# Patient Record
Sex: Male | Born: 1935 | Race: Black or African American | Hispanic: No | Marital: Married | Smoking: Smoker, current status unknown
Health system: Southern US, Community
[De-identification: ages and names within clinical notes are randomized; demographics above are authoritative.]

## PROBLEM LIST (undated history)

## (undated) DIAGNOSIS — R972 Elevated prostate specific antigen [PSA]: Secondary | ICD-10-CM

## (undated) DIAGNOSIS — G309 Alzheimer's disease, unspecified: Secondary | ICD-10-CM

## (undated) DIAGNOSIS — R32 Unspecified urinary incontinence: Secondary | ICD-10-CM

## (undated) DIAGNOSIS — F17200 Nicotine dependence, unspecified, uncomplicated: Secondary | ICD-10-CM

## (undated) DIAGNOSIS — R4182 Altered mental status, unspecified: Secondary | ICD-10-CM

## (undated) DIAGNOSIS — I10 Essential (primary) hypertension: Secondary | ICD-10-CM

## (undated) DIAGNOSIS — E539 Vitamin B deficiency, unspecified: Secondary | ICD-10-CM

## (undated) DIAGNOSIS — N401 Enlarged prostate with lower urinary tract symptoms: Secondary | ICD-10-CM

## (undated) DIAGNOSIS — N138 Other obstructive and reflux uropathy: Secondary | ICD-10-CM

## (undated) DIAGNOSIS — J309 Allergic rhinitis, unspecified: Secondary | ICD-10-CM

## (undated) DIAGNOSIS — M1990 Unspecified osteoarthritis, unspecified site: Secondary | ICD-10-CM

## (undated) DIAGNOSIS — F0280 Dementia in other diseases classified elsewhere without behavioral disturbance: Secondary | ICD-10-CM

## (undated) DIAGNOSIS — D649 Anemia, unspecified: Secondary | ICD-10-CM

## (undated) DIAGNOSIS — F0390 Unspecified dementia without behavioral disturbance: Secondary | ICD-10-CM

## (undated) DIAGNOSIS — E039 Hypothyroidism, unspecified: Secondary | ICD-10-CM

## (undated) DIAGNOSIS — R609 Edema, unspecified: Secondary | ICD-10-CM

## (undated) HISTORY — DX: Unspecified dementia, unspecified severity, without behavioral disturbance, psychotic disturbance, mood disturbance, and anxiety: F03.90

## (undated) HISTORY — DX: Essential (primary) hypertension: I10

## (undated) HISTORY — DX: Altered mental status, unspecified: R41.82

## (undated) HISTORY — DX: Anemia, unspecified: D64.9

---

## 2000-01-22 ENCOUNTER — Emergency Department (HOSPITAL_COMMUNITY): Admission: EM | Admit: 2000-01-22 | Discharge: 2000-01-22 | Payer: Self-pay | Admitting: *Deleted

## 2003-02-09 ENCOUNTER — Encounter: Payer: Self-pay | Admitting: Emergency Medicine

## 2003-02-09 ENCOUNTER — Emergency Department (HOSPITAL_COMMUNITY): Admission: EM | Admit: 2003-02-09 | Discharge: 2003-02-09 | Payer: Self-pay | Admitting: Emergency Medicine

## 2006-04-07 ENCOUNTER — Emergency Department (HOSPITAL_COMMUNITY): Admission: EM | Admit: 2006-04-07 | Discharge: 2006-04-07 | Payer: Self-pay | Admitting: Emergency Medicine

## 2007-07-21 ENCOUNTER — Emergency Department (HOSPITAL_COMMUNITY): Admission: EM | Admit: 2007-07-21 | Discharge: 2007-07-22 | Payer: Self-pay | Admitting: Emergency Medicine

## 2011-02-10 ENCOUNTER — Ambulatory Visit: Payer: Self-pay | Admitting: Endocrinology

## 2011-03-12 ENCOUNTER — Encounter: Payer: Self-pay | Admitting: Endocrinology

## 2011-03-12 ENCOUNTER — Other Ambulatory Visit (INDEPENDENT_AMBULATORY_CARE_PROVIDER_SITE_OTHER): Payer: Medicare Other

## 2011-03-12 ENCOUNTER — Ambulatory Visit (INDEPENDENT_AMBULATORY_CARE_PROVIDER_SITE_OTHER): Payer: Medicare Other | Admitting: Endocrinology

## 2011-03-12 DIAGNOSIS — F0280 Dementia in other diseases classified elsewhere without behavioral disturbance: Secondary | ICD-10-CM

## 2011-03-12 DIAGNOSIS — M1990 Unspecified osteoarthritis, unspecified site: Secondary | ICD-10-CM

## 2011-03-12 DIAGNOSIS — G309 Alzheimer's disease, unspecified: Secondary | ICD-10-CM

## 2011-03-12 DIAGNOSIS — E079 Disorder of thyroid, unspecified: Secondary | ICD-10-CM

## 2011-03-12 DIAGNOSIS — N400 Enlarged prostate without lower urinary tract symptoms: Secondary | ICD-10-CM

## 2011-03-12 DIAGNOSIS — E039 Hypothyroidism, unspecified: Secondary | ICD-10-CM

## 2011-03-12 DIAGNOSIS — I10 Essential (primary) hypertension: Secondary | ICD-10-CM

## 2011-03-12 DIAGNOSIS — J309 Allergic rhinitis, unspecified: Secondary | ICD-10-CM

## 2011-03-12 LAB — TSH: TSH: 2.61 u[IU]/mL (ref 0.35–5.50)

## 2011-03-12 NOTE — Patient Instructions (Addendum)
Let's check your thyroid blood test today.  Let's also check a thyroid ultrasound.  you will be called with a day and time for an appointment for the ultrasound.  Results will be sent to [facility].  Return here as needed.  (update: tsh result is normal--sent to [facility])

## 2011-03-12 NOTE — Progress Notes (Signed)
  Subjective:    Patient ID: Richard Booth, male    DOB: 1934-10-06, 77 y.o.   MRN: [id number]  HPI Pt states few years of hypothyroidism.  He takes synthroid.  He feels well, except for a few years of slight congestion in the nose, and assoc rhinorrhea.   Past Medical History  Diagnosis Date  . Anemia   . Hypertension   . Dementia   . Altered mental status    No past surgical history on file.  reports that he has been smoking.  He does not have any smokeless tobacco history on file. He reports that he drinks alcohol. His drug history not on file. Retired. Married. Lives at [facility]. family history is not on file.no thyroid dz.  Allergies not on file  Review of Systems denies depression, cramps, sob, weight gain, memory loss, constipation, numbness, blurry vision, chest-pain, myalgias, and syncope.  He has only some male-pattern hair loss, and dry skin.   He has easy bruising.      Objective:   Physical Exam VS: see vs page GEN: elderly, frail, no distress HEAD: head: no deformity eyes: no periorbital swelling, no proptosis external nose and ears are normal mouth: no lesion seen NECK: supple, thyroid is not enlarged CHEST WALL: no deformity BREASTS:  No gynecomastia CV: reg rate and rhythm, no murmur ABD: abdomen is soft, nontender.  no hepatosplenomegaly.  not distended.  no hernia MUSCULOSKELETAL: muscle bulk and strength are grossly normal for age.  no obvious joint swelling.  gait is steady with a walker. EXTEMITIES: no deformity.  no ulcer on the feet.  feet are of normal color and temp.  no edema PULSES: dorsalis pedis intact bilat.   NEURO:  cn 2-12 grossly intact.   readily moves all 4's.  sensation is intact to touch on all 4'.  No tremor SKIN:  Normal texture and temperature.  No rash or suspicious lesion is visible.   NODES:  None palpable at the neck PSYCH: Does not appear anxious nor depressed.  alert and cooperative.  He is oriented to self only.         Labs:  i have reviewed "e-chart" from [facility].  No entries relevant to the thyroid.  Lab Results  Component Value Date   TSH 2.61 03/12/2011       Assessment & Plan:  Hypothyroidism, well-replaced. Nasal congestion, unrelated to thyroid. Dementia, not related to thyroid.

## 2011-03-14 DIAGNOSIS — N400 Enlarged prostate without lower urinary tract symptoms: Secondary | ICD-10-CM | POA: Insufficient documentation

## 2011-03-14 DIAGNOSIS — G309 Alzheimer's disease, unspecified: Secondary | ICD-10-CM | POA: Insufficient documentation

## 2011-03-14 DIAGNOSIS — I10 Essential (primary) hypertension: Secondary | ICD-10-CM | POA: Insufficient documentation

## 2011-03-14 DIAGNOSIS — J309 Allergic rhinitis, unspecified: Secondary | ICD-10-CM | POA: Insufficient documentation

## 2011-03-14 DIAGNOSIS — M1990 Unspecified osteoarthritis, unspecified site: Secondary | ICD-10-CM | POA: Insufficient documentation

## 2011-03-19 ENCOUNTER — Other Ambulatory Visit: Payer: Medicaid Other

## 2011-03-24 ENCOUNTER — Ambulatory Visit
Admission: RE | Admit: 2011-03-24 | Discharge: 2011-03-24 | Disposition: A | Discharge status: Home / Self Care | Payer: PRIVATE HEALTH INSURANCE | Source: Ambulatory Visit | Attending: Endocrinology | Admitting: Endocrinology

## 2011-03-24 DIAGNOSIS — E079 Disorder of thyroid, unspecified: Secondary | ICD-10-CM

## 2012-03-17 ENCOUNTER — Emergency Department (HOSPITAL_COMMUNITY): Payer: Medicare Other

## 2012-03-17 ENCOUNTER — Encounter (HOSPITAL_COMMUNITY): Payer: Self-pay | Admitting: Emergency Medicine

## 2012-03-17 ENCOUNTER — Emergency Department (HOSPITAL_COMMUNITY)
Admission: EM | Admit: 2012-03-17 | Discharge: 2012-03-17 | Disposition: A | Discharge status: Home Health | Payer: Medicare Other | Attending: Emergency Medicine | Admitting: Emergency Medicine

## 2012-03-17 DIAGNOSIS — F0390 Unspecified dementia without behavioral disturbance: Secondary | ICD-10-CM | POA: Insufficient documentation

## 2012-03-17 DIAGNOSIS — R55 Syncope and collapse: Secondary | ICD-10-CM | POA: Insufficient documentation

## 2012-03-17 DIAGNOSIS — N390 Urinary tract infection, site not specified: Secondary | ICD-10-CM | POA: Insufficient documentation

## 2012-03-17 DIAGNOSIS — Z79899 Other long term (current) drug therapy: Secondary | ICD-10-CM | POA: Insufficient documentation

## 2012-03-17 DIAGNOSIS — I10 Essential (primary) hypertension: Secondary | ICD-10-CM | POA: Insufficient documentation

## 2012-03-17 LAB — COMPREHENSIVE METABOLIC PANEL
ALT: 21 U/L (ref 0–53)
AST: 20 U/L (ref 0–37)
Calcium: 9.3 mg/dL (ref 8.4–10.5)
Creatinine, Ser: 1.01 mg/dL (ref 0.50–1.35)
Sodium: 139 mEq/L (ref 135–145)
Total Protein: 7.3 g/dL (ref 6.0–8.3)

## 2012-03-17 LAB — DIFFERENTIAL
Basophils Absolute: 0 10*3/uL (ref 0.0–0.1)
Lymphocytes Relative: 24 % (ref 12–46)
Monocytes Absolute: 0.4 10*3/uL (ref 0.1–1.0)
Neutro Abs: 3 10*3/uL (ref 1.7–7.7)
Neutrophils Relative %: 66 % (ref 43–77)

## 2012-03-17 LAB — URINE MICROSCOPIC-ADD ON

## 2012-03-17 LAB — URINALYSIS, ROUTINE W REFLEX MICROSCOPIC
Leukocytes, UA: NEGATIVE
Nitrite: POSITIVE — AB
Specific Gravity, Urine: 1.008 (ref 1.005–1.030)
pH: 7 (ref 5.0–8.0)

## 2012-03-17 LAB — CBC
HCT: 39 % (ref 39.0–52.0)
RDW: 14 % (ref 11.5–15.5)
WBC: 4.5 10*3/uL (ref 4.0–10.5)

## 2012-03-17 LAB — PROTIME-INR
INR: 0.99 (ref 0.00–1.49)
Prothrombin Time: 13.3 seconds (ref 11.6–15.2)

## 2012-03-17 MED ORDER — CIPROFLOXACIN HCL 500 MG PO TABS
500.0000 mg | ORAL_TABLET | Freq: Once | ORAL | Status: AC
  Administered 2012-03-17: 500 mg via ORAL
  Filled 2012-03-17: qty 1

## 2012-03-17 MED ORDER — SODIUM CHLORIDE 0.9 % IV SOLN
INTRAVENOUS | Status: DC
  Administered 2012-03-17: 11:00:00 via INTRAVENOUS

## 2012-03-17 MED ORDER — ONDANSETRON HCL 4 MG/2ML IJ SOLN
4.0000 mg | Freq: Once | INTRAMUSCULAR | Status: AC
  Administered 2012-03-17: 4 mg via INTRAVENOUS
  Filled 2012-03-17: qty 2

## 2012-03-17 MED ORDER — CIPROFLOXACIN HCL 500 MG PO TABS: 500.0000 mg | ORAL_TABLET | Freq: Two times a day (BID) | ORAL | Status: AC

## 2012-03-17 MED ORDER — POTASSIUM CHLORIDE 20 MEQ/15ML (10%) PO LIQD
20.0000 meq | Freq: Once | ORAL | Status: AC
  Administered 2012-03-17: 20 meq via ORAL
  Filled 2012-03-17: qty 15

## 2012-03-17 MED ORDER — ONDANSETRON HCL 4 MG PO TABS: 4.0000 mg | ORAL_TABLET | Freq: Four times a day (QID) | ORAL | Status: AC

## 2012-03-17 NOTE — ED Notes (Signed)
VHQ:IO96<EX> Expected date:03/17/12<BR> Expected time: 9:42 AM<BR> Means of arrival:Ambulance<BR> Comments:<BR> Vomiting

## 2012-03-17 NOTE — ED Notes (Addendum)
Pt bought in by EMS for a near syncopal episode after eating breakfast neg for LOC.EMS stated that pt vomited in route

## 2012-03-17 NOTE — ED Notes (Signed)
In and out cath attempted no urine in bladder

## 2012-03-17 NOTE — ED Provider Notes (Signed)
History     CSN: [id number]  Arrival date & time 03/17/12  1000   First MD Initiated Contact with Patient 03/17/12 1019      Chief Complaint  Patient presents with  . Near Syncope  . Emesis    (Consider location/radiation/quality/duration/timing/severity/associated sxs/prior treatment) The history is provided by the nursing home and the EMS personnel. No language interpreter was used.  level V caveat due to dementia. Patient arrives via EMS for evaluation of a near syncopal event which occurred at breakfast. Patient did not lose consciousness. He vomited after this episode. At this time he has no complaints. He denies chest pain, shortness of breath, abdominal pain, headache, nausea.  Past Medical History  Diagnosis Date  . Anemia   . Hypertension   . Dementia   . Altered mental status     History reviewed. No pertinent past surgical history.  No family history on file.  History  Substance Use Topics  . Smoking status: Smoker, Current Status Unknown  . Smokeless tobacco: Not on file  . Alcohol Use: No      Review of Systems  Unable to perform ROS: Dementia    Allergies  Review of patient's allergies indicates not on file.  Home Medications   Current Outpatient Rx  Name Route Sig Dispense Refill  . ACETAMINOPHEN 500 MG PO TABS Oral Take 500 mg by mouth 3 (three) times daily.    Marland Kitchen ENSURE PLUS PO LIQD Oral Take 237 mLs by mouth 3 (three) times daily with meals.    Di Kindle SULFATE 325 (65 FE) MG PO TABS Oral Take 325 mg by mouth daily with breakfast.    . FINASTERIDE 5 MG PO TABS Oral Take 5 mg by mouth daily.      Marland Kitchen FLUTICASONE PROPIONATE 50 MCG/ACT NA SUSP Nasal 1 spray by Nasal route daily.      Marland Kitchen HYDROCHLOROTHIAZIDE 12.5 MG PO CAPS Oral Take 12.5 mg by mouth daily. Hold if systolic pressure is less than 110 or dystolic blood pressure is is less than 60    . LEVOTHYROXINE SODIUM 75 MCG PO TABS Oral Take 75 mcg by mouth daily.      Marland Kitchen LISINOPRIL 10 MG PO TABS  Oral Take 2.5 mg by mouth daily. 2.5 mg. Hold if systolic pressure is less than 110 or dystolic blood pressure is is less than 60    . MEMANTINE HCL 5 MG PO TABS Oral Take 5 mg by mouth 2 (two) times daily.    Marland Kitchen DAILY VITE PO TABS Oral Take 1 tablet by mouth daily.      Marland Kitchen SALINE NASAL MIST NA Nasal Place 2 sprays into the nose every 4 (four) hours.    . TERAZOSIN HCL 5 MG PO CAPS Oral Take 5 mg by mouth at bedtime.      . TRIAMCINOLONE ACETONIDE 0.025 % EX CREA Topical Apply 1 application topically 3 (three) times daily. Apply to bilateral lower extremities 3 times daily due to sores and scratching    . VITAMIN B-12 1000 MCG PO TABS Oral Take 1,000 mcg by mouth daily.      Marland Kitchen CIPROFLOXACIN HCL 500 MG PO TABS Oral Take 1 tablet (500 mg total) by mouth 2 (two) times daily. 14 tablet 0  . ONDANSETRON HCL 4 MG PO TABS Oral Take 1 tablet (4 mg total) by mouth every 6 (six) hours. 12 tablet 0    BP 158/100  Pulse 67  Temp(Src) 97.7 F (36.5 C) (Oral)  Resp 16  SpO2 96%  Physical Exam  Nursing note and vitals reviewed. Constitutional: He appears well-developed and well-nourished.  HENT:  Head: Normocephalic and atraumatic.  Mouth/Throat: Oropharynx is clear and moist.  Eyes: Conjunctivae and EOM are normal. Pupils are equal, round, and reactive to light.  Neck: Normal range of motion. Neck supple.  Cardiovascular: Regular rhythm, normal heart sounds and intact distal pulses.  Exam reveals no gallop and no friction rub.   No murmur heard.      Bradycardic rate  Pulmonary/Chest: Effort normal and breath sounds normal. No respiratory distress. He exhibits no tenderness.  Abdominal: Soft. Bowel sounds are normal. There is no tenderness.  Musculoskeletal: Normal range of motion. He exhibits no tenderness.  Neurological: He is alert. No cranial nerve deficit.  Skin: Skin is warm and dry. No rash noted.    ED Course  Procedures (including critical care time)   Date: 03/17/2012  Rate: 54   Rhythm: sinus bradycardia  QRS Axis: normal  Intervals: normal  ST/T Wave abnormalities: normal  Conduction Disutrbances:none  Narrative Interpretation:   Old EKG Reviewed: unchanged  Labs Reviewed  CBC - Abnormal; Notable for the following:    RBC 4.12 (*)    Hemoglobin 12.8 (*)    All other components within normal limits  URINALYSIS, ROUTINE W REFLEX MICROSCOPIC - Abnormal; Notable for the following:    Hgb urine dipstick MODERATE (*)    Nitrite POSITIVE (*)    All other components within normal limits  COMPREHENSIVE METABOLIC PANEL - Abnormal; Notable for the following:    Potassium 3.3 (*)    CO2 33 (*)    GFR calc non Af Amer 71 (*)    GFR calc Af Amer 82 (*)    All other components within normal limits  URINE MICROSCOPIC-ADD ON - Abnormal; Notable for the following:    Bacteria, UA MANY (*)    All other components within normal limits  DIFFERENTIAL  PROTIME-INR   Dg Chest 2 View  03/17/2012  *RADIOLOGY REPORT*  Clinical Data: Diabetes and hypertension.  CHEST - 2 VIEW  Comparison: PA and lateral chest 01/18/2007.  Findings: Mild cardiomegaly without pulmonary edema is identified. The lungs are clear.  Tiny left pleural effusion versus pleural scar is noted. No pneumothorax.  IMPRESSION: Tiny left pleural effusion versus pleural scar.  Otherwise negative.  Original Report Authenticated By: Bernadene Bell. D'ALESSIO, M.D.     1. UTI (lower urinary tract infection)   2. Near syncope       MDM  UTI most likely the cause of the near syncopal event. He received IV fluids and antiemetics the emergency department. He is able tolerate oral intake. We'll be discharged home with Cipro. Provided instructions for return        Dayton Bailiff, MD 03/17/12 1526

## 2012-03-17 NOTE — Discharge Instructions (Signed)
Near-Syncope Near-syncope is sudden weakness, dizziness, or feeling like you might pass out (faint). This may occur when getting up after sitting or while standing for a long period of time. Near-syncope can be caused by a drop in blood pressure. This is a common reaction, but it may occur to a greater degree in people taking medicines to control their blood pressure. Fainting often occurs when the blood pressure or pulse is too low to provide enough blood flow to the brain to keep you conscious. Fainting and near-syncope are not usually due to serious medical problems. However, certain people should be more cautious in the event of near-syncope, including elderly patients, patients with diabetes, and patients with a history of heart conditions (especially irregular rhythms).  CAUSES   Drop in blood pressure.   Physical pain.   Dehydration.   Heat exhaustion.   Emotional distress.   Low blood sugar.   Internal bleeding.   Heart and circulatory problems.   Infections.  SYMPTOMS   Dizziness.   Feeling sick to your stomach (nauseous).   Nearly fainting.   Body numbness.   Turning pale.   Tunnel vision.   Weakness.  HOME CARE INSTRUCTIONS   Lie down right away if you start feeling like you might faint. Breathe deeply and steadily. Wait until all the symptoms have passed. Most of these episodes last only a few minutes. You may feel tired for several hours.   Drink enough fluids to keep your urine clear or pale yellow.   If you are taking blood pressure or heart medicine, get up slowly, taking several minutes to sit and then stand. This can reduce dizziness that is caused by a drop in blood pressure.  SEEK IMMEDIATE MEDICAL CARE IF:   You have a severe headache.   Unusual pain develops in the chest, abdomen, or back.   There is bleeding from the mouth or rectum, or you have black or tarry stool.   An irregular heartbeat or a very rapid pulse develops.   You have  repeated fainting or seizure-like jerking during an episode.   You faint when sitting or lying down.   You develop confusion.   You have difficulty walking.   Severe weakness develops.   Vision problems develop.  MAKE SURE YOU:   Understand these instructions.   Will watch your condition.   Will get help right away if you are not doing well or get worse.  Document Released: 11/09/2005 Document Revised: 10/29/2011 Document Reviewed: 12/26/2010 [employer] Patient Information 2012 [employer]. 

## 2012-03-22 ENCOUNTER — Encounter (HOSPITAL_COMMUNITY): Payer: Self-pay | Admitting: Emergency Medicine

## 2012-03-22 ENCOUNTER — Emergency Department (HOSPITAL_COMMUNITY)
Admission: EM | Admit: 2012-03-22 | Discharge: 2012-03-23 | Disposition: A | Discharge status: Skilled Nursing Facility | Payer: Medicare Other | Attending: Emergency Medicine | Admitting: Emergency Medicine

## 2012-03-22 DIAGNOSIS — I10 Essential (primary) hypertension: Secondary | ICD-10-CM | POA: Insufficient documentation

## 2012-03-22 DIAGNOSIS — F0390 Unspecified dementia without behavioral disturbance: Secondary | ICD-10-CM | POA: Insufficient documentation

## 2012-03-22 DIAGNOSIS — Z79899 Other long term (current) drug therapy: Secondary | ICD-10-CM | POA: Insufficient documentation

## 2012-03-22 DIAGNOSIS — Z711 Person with feared health complaint in whom no diagnosis is made: Secondary | ICD-10-CM

## 2012-03-22 NOTE — ED Notes (Signed)
EMS brings pt from [facility], pt co single episode of hematuria, pt currently being treated for UTI.

## 2012-03-22 NOTE — ED Notes (Signed)
Orie Rout [phone]. Daughter- please call with information

## 2012-03-22 NOTE — ED Provider Notes (Signed)
History     CSN: [id number]  Arrival date & time 03/22/12  2100   First MD Initiated Contact with Patient 03/22/12 2300      Chief Complaint  Patient presents with  . Hematuria    (Consider location/radiation/quality/duration/timing/severity/associated sxs/prior treatment) HPI Comments: 78 y/o male presents after having single episodes of hematuria - EMS states pt is currently being treated for UTI with abx.  Pt has no c/o and has dementia - unsure why he is here, has no c/o.  Patient is a 78 y.o. male presenting with hematuria. The history is provided by the EMS personnel and the nursing home. The history is limited by the condition of the patient (dementia).  Hematuria    Past Medical History  Diagnosis Date  . Anemia   . Hypertension   . Dementia   . Altered mental status     History reviewed. No pertinent past surgical history.  No family history on file.  History  Substance Use Topics  . Smoking status: Smoker, Current Status Unknown  . Smokeless tobacco: Not on file  . Alcohol Use: No      Review of Systems  Unable to perform ROS: Dementia  Genitourinary: Positive for hematuria.    Allergies  Review of patient's allergies indicates no known allergies.  Home Medications   Current Outpatient Rx  Name Route Sig Dispense Refill  . ACETAMINOPHEN 500 MG PO TABS Oral Take 500 mg by mouth 3 (three) times daily.    Marland Kitchen CIPROFLOXACIN HCL 500 MG PO TABS Oral Take 1 tablet (500 mg total) by mouth 2 (two) times daily. 14 tablet 0  . ENSURE PLUS PO LIQD Oral Take 237 mLs by mouth 3 (three) times daily with meals.    Di Kindle SULFATE 325 (65 FE) MG PO TABS Oral Take 325 mg by mouth daily with breakfast.    . FINASTERIDE 5 MG PO TABS Oral Take 5 mg by mouth daily.      Marland Kitchen FLUTICASONE PROPIONATE 50 MCG/ACT NA SUSP Nasal 1 spray by Nasal route daily.      Marland Kitchen HYDROCHLOROTHIAZIDE 12.5 MG PO CAPS Oral Take 12.5 mg by mouth daily. Hold if systolic pressure is less than 110  or dystolic blood pressure is is less than 60    . LEVOTHYROXINE SODIUM 75 MCG PO TABS Oral Take 75 mcg by mouth daily.      Marland Kitchen LISINOPRIL 10 MG PO TABS Oral Take 2.5 mg by mouth daily. 2.5 mg. Hold if systolic pressure is less than 110 or dystolic blood pressure is is less than 60    . MEMANTINE HCL 5 MG PO TABS Oral Take 5 mg by mouth 2 (two) times daily.    Marland Kitchen DAILY VITE PO TABS Oral Take 1 tablet by mouth daily.      Marland Kitchen ONDANSETRON HCL 4 MG PO TABS Oral Take 1 tablet (4 mg total) by mouth every 6 (six) hours. 12 tablet 0  . SALINE NASAL MIST NA Nasal Place 2 sprays into the nose every 4 (four) hours.    . TERAZOSIN HCL 5 MG PO CAPS Oral Take 5 mg by mouth at bedtime.      . TRIAMCINOLONE ACETONIDE 0.025 % EX CREA Topical Apply 1 application topically 3 (three) times daily. Apply to bilateral lower extremities 3 times daily due to sores and scratching    . VITAMIN B-12 1000 MCG PO TABS Oral Take 1,000 mcg by mouth daily.  BP 156/91  Pulse 62  Temp 98 F (36.7 C)  SpO2 99%  Physical Exam  Nursing note and vitals reviewed. Constitutional: He appears well-developed and well-nourished. No distress.  HENT:  Head: Normocephalic and atraumatic.  Mouth/Throat: Oropharynx is clear and moist. No oropharyngeal exudate.  Eyes: Conjunctivae and EOM are normal. Pupils are equal, round, and reactive to light. Right eye exhibits no discharge. Left eye exhibits no discharge. No scleral icterus.  Neck: Normal range of motion. Neck supple. No JVD present. No thyromegaly present.  Cardiovascular: Normal rate, regular rhythm, normal heart sounds and intact distal pulses.  Exam reveals no gallop and no friction rub.   No murmur heard. Pulmonary/Chest: Effort normal and breath sounds normal. No respiratory distress. He has no wheezes. He has no rales.  Abdominal: Soft. Bowel sounds are normal. He exhibits no distension and no mass. There is no tenderness.  Genitourinary:       Non tender abd, normal  appearing penis and scrotum, no blood at urethral meatus  Musculoskeletal: Normal range of motion. He exhibits no edema and no tenderness.  Lymphadenopathy:    He has no cervical adenopathy.  Neurological: He is alert. Coordination normal.       Pt talks but is unable to answer questions re: history  Skin: Skin is warm and dry. No rash noted. No erythema.  Psychiatric: He has a normal mood and affect. His behavior is normal.    ED Course  Procedures (including critical care time)  Labs Reviewed  URINALYSIS, ROUTINE W REFLEX MICROSCOPIC - Abnormal; Notable for the following:    Hgb urine dipstick TRACE (*)    All other components within normal limits  URINE MICROSCOPIC-ADD ON  URINE CULTURE   No results found.   1. Worried well       MDM  Pt has possible hematuria - has no abd ttp, no VS abnormalities of significance.  In and out cath    Urinalysis reviewed shows no signs of hematuria or infection. Culture sent  Vida Roller, MD 03/23/12 [phone]

## 2012-03-23 LAB — URINALYSIS, ROUTINE W REFLEX MICROSCOPIC
Bilirubin Urine: NEGATIVE
Nitrite: NEGATIVE
Protein, ur: NEGATIVE mg/dL
Specific Gravity, Urine: 1.015 (ref 1.005–1.030)
Urobilinogen, UA: 0.2 mg/dL (ref 0.0–1.0)

## 2012-03-23 LAB — URINE MICROSCOPIC-ADD ON

## 2012-03-23 NOTE — ED Notes (Signed)
[facility] called for transport.  

## 2012-03-23 NOTE — Discharge Instructions (Signed)
Your urine test is normal, see your doctor as needed

## 2012-03-24 LAB — URINE CULTURE: Culture: NO GROWTH

## 2013-03-08 ENCOUNTER — Encounter (HOSPITAL_COMMUNITY): Payer: Self-pay | Admitting: Emergency Medicine

## 2013-03-08 ENCOUNTER — Emergency Department (HOSPITAL_COMMUNITY)
Admission: EM | Admit: 2013-03-08 | Discharge: 2013-03-08 | Disposition: A | Discharge status: Home / Self Care | Payer: Medicare Other | Attending: Emergency Medicine | Admitting: Emergency Medicine

## 2013-03-08 ENCOUNTER — Emergency Department (HOSPITAL_COMMUNITY): Payer: Medicare Other

## 2013-03-08 DIAGNOSIS — I498 Other specified cardiac arrhythmias: Secondary | ICD-10-CM | POA: Insufficient documentation

## 2013-03-08 DIAGNOSIS — F0390 Unspecified dementia without behavioral disturbance: Secondary | ICD-10-CM | POA: Insufficient documentation

## 2013-03-08 DIAGNOSIS — Z79899 Other long term (current) drug therapy: Secondary | ICD-10-CM | POA: Insufficient documentation

## 2013-03-08 DIAGNOSIS — R112 Nausea with vomiting, unspecified: Secondary | ICD-10-CM | POA: Insufficient documentation

## 2013-03-08 DIAGNOSIS — D649 Anemia, unspecified: Secondary | ICD-10-CM

## 2013-03-08 DIAGNOSIS — R55 Syncope and collapse: Secondary | ICD-10-CM

## 2013-03-08 DIAGNOSIS — I10 Essential (primary) hypertension: Secondary | ICD-10-CM | POA: Insufficient documentation

## 2013-03-08 DIAGNOSIS — R001 Bradycardia, unspecified: Secondary | ICD-10-CM

## 2013-03-08 DIAGNOSIS — Z87891 Personal history of nicotine dependence: Secondary | ICD-10-CM | POA: Insufficient documentation

## 2013-03-08 LAB — COMPREHENSIVE METABOLIC PANEL
ALT: 18 U/L (ref 0–53)
AST: 25 U/L (ref 0–37)
CO2: 31 mEq/L (ref 19–32)
Calcium: 8.6 mg/dL (ref 8.4–10.5)
Creatinine, Ser: 1.08 mg/dL (ref 0.50–1.35)
GFR calc Af Amer: 75 mL/min — ABNORMAL LOW (ref >90)
GFR calc non Af Amer: 65 mL/min — ABNORMAL LOW (ref >90)
Glucose, Bld: 97 mg/dL (ref 70–99)
Sodium: 139 mEq/L (ref 135–145)
Total Protein: 6.5 g/dL (ref 6.0–8.3)

## 2013-03-08 LAB — URINALYSIS, ROUTINE W REFLEX MICROSCOPIC
Bilirubin Urine: NEGATIVE
Nitrite: NEGATIVE
Specific Gravity, Urine: 1.018 (ref 1.005–1.030)
Urobilinogen, UA: 1 mg/dL (ref 0.0–1.0)
pH: 7 (ref 5.0–8.0)

## 2013-03-08 LAB — CBC WITH DIFFERENTIAL/PLATELET
Basophils Absolute: 0 10*3/uL (ref 0.0–0.1)
Eosinophils Absolute: 0.1 10*3/uL (ref 0.0–0.7)
Eosinophils Relative: 2 % (ref 0–5)
HCT: 37.3 % — ABNORMAL LOW (ref 39.0–52.0)
MCH: 31.8 pg (ref 26.0–34.0)
MCHC: 33.8 g/dL (ref 30.0–36.0)
MCV: 94.2 fL (ref 78.0–100.0)
Monocytes Absolute: 0.3 10*3/uL (ref 0.1–1.0)
Platelets: 128 10*3/uL — ABNORMAL LOW (ref 150–400)
RDW: 13.2 % (ref 11.5–15.5)

## 2013-03-08 LAB — POCT I-STAT TROPONIN I: Troponin i, poc: 0 ng/mL (ref 0.00–0.08)

## 2013-03-08 MED ORDER — SODIUM CHLORIDE 0.9 % IV BOLUS (SEPSIS)
1000.0000 mL | Freq: Once | INTRAVENOUS | Status: AC
  Administered 2013-03-08: 1000 mL via INTRAVENOUS

## 2013-03-08 NOTE — ED Notes (Signed)
Patient transported to CT 

## 2013-03-08 NOTE — ED Provider Notes (Signed)
History     CSN: [id number]  Arrival date & time 03/08/13  1207   First MD Initiated Contact with Patient 03/08/13 1216      Chief Complaint  Patient presents with  . Loss of Consciousness    (Consider location/radiation/quality/duration/timing/severity/associated sxs/prior treatment) Patient is a 79 y.o. male presenting with syncope. The history is provided by the nursing home. The history is limited by the condition of the patient (Dementia).  Loss of Consciousness   He was reported to have had a syncopal episode this morning lasting about 1 minute. This was followed by nausea and vomiting. He was treated by EMS with intravenous Zofran. Patient has severe dementia and is not able to give any other history. He has no complaints currently.  Past Medical History  Diagnosis Date  . Anemia   . Hypertension   . Dementia   . Altered mental status     History reviewed. No pertinent past surgical history.  No family history on file.  History  Substance Use Topics  . Smoking status: Smoker, Current Status Unknown  . Smokeless tobacco: Not on file  . Alcohol Use: No      Review of Systems  Unable to perform ROS: Dementia  Cardiovascular: Positive for syncope.    Allergies  Review of patient's allergies indicates no known allergies.  Home Medications   Current Outpatient Rx  Name  Route  Sig  Dispense  Refill  . acetaminophen (TYLENOL) 500 MG tablet   Oral   Take 500 mg by mouth 3 (three) times daily.         . Ensure Plus (ENSURE PLUS) LIQD   Oral   Take 237 mLs by mouth 3 (three) times daily with meals.         . ferrous sulfate 325 (65 FE) MG tablet   Oral   Take 325 mg by mouth daily with breakfast.         . finasteride (PROSCAR) 5 MG tablet   Oral   Take 5 mg by mouth daily.           . fluticasone (FLONASE) 50 MCG/ACT nasal spray   Nasal   1 spray by Nasal route daily.           . hydrochlorothiazide (MICROZIDE) 12.5 MG capsule   Oral    Take 12.5 mg by mouth daily. Hold if systolic pressure is less than 110 or dystolic blood pressure is is less than 60         . levothyroxine (SYNTHROID, LEVOTHROID) 75 MCG tablet   Oral   Take 75 mcg by mouth daily.           Marland Kitchen lisinopril (PRINIVIL,ZESTRIL) 10 MG tablet   Oral   Take 2.5 mg by mouth daily. 2.5 mg. Hold if systolic pressure is less than 110 or dystolic blood pressure is is less than 60         . memantine (NAMENDA) 5 MG tablet   Oral   Take 5 mg by mouth 2 (two) times daily.         . Multiple Vitamin (DAILY VITE) TABS   Oral   Take 1 tablet by mouth daily.           Marland Kitchen SALINE NASAL MIST NA   Nasal   Place 2 sprays into the nose every 4 (four) hours.         Marland Kitchen terazosin (HYTRIN) 5 MG capsule   Oral   Take  5 mg by mouth at bedtime.           . triamcinolone (KENALOG) 0.025 % cream   Topical   Apply 1 application topically 3 (three) times daily. Apply to bilateral lower extremities 3 times daily due to sores and scratching         . vitamin B-12 (CYANOCOBALAMIN) 1000 MCG tablet   Oral   Take 1,000 mcg by mouth daily.             BP 106/70  Pulse 57  Temp(Src) 97.4 F (36.3 C) (Oral)  Resp 18  Physical Exam  Nursing note and vitals reviewed.  79 year old male, resting comfortably and in no acute distress. Vital signs are significant for mild bradycardia with heart rate 57. Oxygen saturation is 96%, which is normal. Head is normocephalic and atraumatic. Pupils are 2 mm and minimally reactive, EOMI. Arcus senilis is present. Oropharynx is clear. Neck is nontender and supple without adenopathy or JVD. Back is nontender and there is no CVA tenderness. Lungs are clear without rales, wheezes, or rhonchi. Chest is nontender. Heart has regular rate and rhythm without murmur. Abdomen is soft, flat, nontender without masses or hepatosplenomegaly and peristalsis is normoactive. Extremities have trace edema, full range of motion is  present. Skin is warm and dry without rash. Neurologic: He is awake and alert and oriented to person but not place or time, cranial nerves are intact, there are no motor or sensory deficits.  ED Course  Procedures (including critical care time)  Results for orders placed during the hospital encounter of 03/08/13  CBC WITH DIFFERENTIAL      Result Value Range   WBC 4.6  4.0 - 10.5 K/uL   RBC 3.96 (*) 4.22 - 5.81 MIL/uL   Hemoglobin 12.6 (*) 13.0 - 17.0 g/dL   HCT 16.1 (*) 09.6 - 04.5 %   MCV 94.2  78.0 - 100.0 fL   MCH 31.8  26.0 - 34.0 pg   MCHC 33.8  30.0 - 36.0 g/dL   RDW 40.9  81.1 - 91.4 %   Platelets 128 (*) 150 - 400 K/uL   Neutrophils Relative 71  43 - 77 %   Neutro Abs 3.3  1.7 - 7.7 K/uL   Lymphocytes Relative 19  12 - 46 %   Lymphs Abs 0.9  0.7 - 4.0 K/uL   Monocytes Relative 7  3 - 12 %   Monocytes Absolute 0.3  0.1 - 1.0 K/uL   Eosinophils Relative 2  0 - 5 %   Eosinophils Absolute 0.1  0.0 - 0.7 K/uL   Basophils Relative 0  0 - 1 %   Basophils Absolute 0.0  0.0 - 0.1 K/uL  COMPREHENSIVE METABOLIC PANEL      Result Value Range   Sodium 139  135 - 145 mEq/L   Potassium 3.6  3.5 - 5.1 mEq/L   Chloride 102  96 - 112 mEq/L   CO2 31  19 - 32 mEq/L   Glucose, Bld 97  70 - 99 mg/dL   BUN 16  6 - 23 mg/dL   Creatinine, Ser 7.82  0.50 - 1.35 mg/dL   Calcium 8.6  8.4 - 95.6 mg/dL   Total Protein 6.5  6.0 - 8.3 g/dL   Albumin 3.2 (*) 3.5 - 5.2 g/dL   AST 25  0 - 37 U/L   ALT 18  0 - 53 U/L   Alkaline Phosphatase 59  39 - 117 U/L  Total Bilirubin 0.5  0.3 - 1.2 mg/dL   GFR calc non Af Amer 65 (*) >90 mL/min   GFR calc Af Amer 75 (*) >90 mL/min  URINALYSIS, ROUTINE W REFLEX MICROSCOPIC      Result Value Range   Color, Urine YELLOW  YELLOW   APPearance CLEAR  CLEAR   Specific Gravity, Urine 1.018  1.005 - 1.030   pH 7.0  5.0 - 8.0   Glucose, UA NEGATIVE  NEGATIVE mg/dL   Hgb urine dipstick NEGATIVE  NEGATIVE   Bilirubin Urine NEGATIVE  NEGATIVE   Ketones, ur  NEGATIVE  NEGATIVE mg/dL   Protein, ur NEGATIVE  NEGATIVE mg/dL   Urobilinogen, UA 1.0  0.0 - 1.0 mg/dL   Nitrite NEGATIVE  NEGATIVE   Leukocytes, UA NEGATIVE  NEGATIVE  POCT I-STAT TROPONIN I      Result Value Range   Troponin i, poc 0.00  0.00 - 0.08 ng/mL   Comment 3            Ct Head Wo Contrast  03/08/2013  *RADIOLOGY REPORT*  Clinical Data: Syncope, dementia  CT HEAD WITHOUT CONTRAST  Technique:  Contiguous axial images were obtained from the base of the skull through the vertex without contrast.  Comparison: 07/22/2007  Findings: Cortical volume loss noted with proportional ventricular prominence.  Periventricular white matter hypodensity likely indicates small vessel ischemic change.  No acute hemorrhage, acute infarction, or mass lesion is identified. Examination is degraded by patient motion. No acute hemorrhage, acute infarction, or mass lesion is seen.  Subcortical white matter hypodensity likely reflecting small vessel ischemic change are stable.  No acute osseous finding.  Orbits and paranasal sinuses are intact.  IMPRESSION: No acute intracranial finding.   Original Report Authenticated By: Christiana Pellant, M.D.     ECG shows normal sinus rhythm with a rate of 58, no ectopy. Normal axis. Normal P wave. Normal compared with ECG of 03/17/2012, no significant changes are seen. QRS. Normal intervals. Normal ST and T waves. Impression: normal ECG.   1. Syncope   2. Bradycardia   3. Dementia   4. Anemia       MDM  Syncope all followed by nausea and vomiting, which may be a vasovagal episode. Old records are reviewed and had a previous ED visit for similar complaint and that was evidently due to urinary tract infection. Therefore, urinalysis will be rechecked. He has been persistently bradycardic and may benefit from for monitoring to see if his heart rate drops well enough to warrant a pacemaker. He is not on any medications which would cause bradycardia.  Workup is negative with  no evidence of UTI. He has mild anemia which is unchanged. Holter monitor can be obtained as an outpatient. He is discharged back to his assisted living facility.  Dione Booze, MD 03/08/13 [phone]

## 2013-03-08 NOTE — ED Notes (Signed)
Per EMS, pt comes from [facility]. Pt had a syncopal episode this morning lasting approx 1 minute. Pt has also been nauseous and vomiting. Pt vomited x2 with EMS, EMS gave 4mg  Zofran IV. Pt is bradycardic with HR in the 50's. BP 108/78. Pt has a hx of dementia that has progressively worsened over past 2 weeks according to staff. CBG 102.

## 2013-03-18 ENCOUNTER — Emergency Department (HOSPITAL_COMMUNITY)
Admission: EM | Admit: 2013-03-18 | Discharge: 2013-03-18 | Disposition: A | Discharge status: Skilled Nursing Facility | Payer: Medicare Other | Attending: Emergency Medicine | Admitting: Emergency Medicine

## 2013-03-18 ENCOUNTER — Encounter (HOSPITAL_COMMUNITY): Payer: Self-pay | Admitting: Emergency Medicine

## 2013-03-18 ENCOUNTER — Emergency Department (HOSPITAL_COMMUNITY): Payer: Medicare Other

## 2013-03-18 DIAGNOSIS — F0390 Unspecified dementia without behavioral disturbance: Secondary | ICD-10-CM | POA: Insufficient documentation

## 2013-03-18 DIAGNOSIS — R4182 Altered mental status, unspecified: Secondary | ICD-10-CM | POA: Insufficient documentation

## 2013-03-18 DIAGNOSIS — Z79899 Other long term (current) drug therapy: Secondary | ICD-10-CM | POA: Insufficient documentation

## 2013-03-18 DIAGNOSIS — D649 Anemia, unspecified: Secondary | ICD-10-CM | POA: Insufficient documentation

## 2013-03-18 DIAGNOSIS — I10 Essential (primary) hypertension: Secondary | ICD-10-CM | POA: Insufficient documentation

## 2013-03-18 DIAGNOSIS — Z8739 Personal history of other diseases of the musculoskeletal system and connective tissue: Secondary | ICD-10-CM | POA: Insufficient documentation

## 2013-03-18 DIAGNOSIS — IMO0002 Reserved for concepts with insufficient information to code with codable children: Secondary | ICD-10-CM | POA: Insufficient documentation

## 2013-03-18 HISTORY — DX: Unspecified osteoarthritis, unspecified site: M19.90

## 2013-03-18 LAB — BASIC METABOLIC PANEL
Chloride: 101 mEq/L (ref 96–112)
GFR calc Af Amer: 78 mL/min — ABNORMAL LOW (ref >90)
GFR calc non Af Amer: 67 mL/min — ABNORMAL LOW (ref >90)
Potassium: 3.6 mEq/L (ref 3.5–5.1)
Sodium: 139 mEq/L (ref 135–145)

## 2013-03-18 LAB — CBC
MCHC: 32.9 g/dL (ref 30.0–36.0)
Platelets: 167 10*3/uL (ref 150–400)
RDW: 13.3 % (ref 11.5–15.5)
WBC: 4.4 10*3/uL (ref 4.0–10.5)

## 2013-03-18 LAB — TROPONIN I: Troponin I: <0.3 ng/mL (ref <0.30)

## 2013-03-18 NOTE — ED Provider Notes (Signed)
History     CSN: [id number]  Arrival date & time 03/18/13  1206   First MD Initiated Contact with Patient 03/18/13 1223      Chief Complaint  Patient presents with  . Altered Mental Status     The history is provided by the patient, a relative, the EMS personnel and the nursing home. History limited by: Level V caveat: Dementia.   I spoke with the nursing home and reports the patient is in his normal state of health over the past several days.  He was in [employer] when the staff noted that he was less responsive with his head leaning over.  He stated he was slightly sweaty.  He did not respond normally at first and so they called EMS and the patient began to respond.  Before EMS took him to the emergency department staff reports the patient is at baseline mental status.  His baseline dementia.  No recent vomiting or diarrhea.  Family is not at bedside and reports that he is also normal appearing at this time.  The patient has no complaints.  He is pleasantly demented.  He denies chest pain shortness of breath.  He denies abdominal pain.  His vital signs are normal with a heart rate of 56.     Past Medical History  Diagnosis Date  . Anemia   . Hypertension   . Dementia   . Altered mental status   . Osteoarthritis     History reviewed. No pertinent past surgical history.  No family history on file.  History  Substance Use Topics  . Smoking status: Smoker, Current Status Unknown  . Smokeless tobacco: Not on file  . Alcohol Use: No      Review of Systems  Unable to perform ROS: Dementia  Psychiatric/Behavioral: Positive for altered mental status.    Allergies  Review of patient's allergies indicates no known allergies.  Home Medications   Current Outpatient Rx  Name  Route  Sig  Dispense  Refill  . acetaminophen (TYLENOL) 500 MG tablet   Oral   Take 500 mg by mouth 3 (three) times daily.         . chlorhexidine (PERIDEX) 0.12 % solution   Mouth/Throat   Use  as directed 15 mLs in the mouth or throat 2 (two) times daily.         . Ensure Plus (ENSURE PLUS) LIQD   Oral   Take 237 mLs by mouth 3 (three) times daily with meals.         . ferrous sulfate 325 (65 FE) MG tablet   Oral   Take 325 mg by mouth daily with breakfast.         . finasteride (PROSCAR) 5 MG tablet   Oral   Take 5 mg by mouth daily.           . fluticasone (FLONASE) 50 MCG/ACT nasal spray   Nasal   1 spray by Nasal route daily.           . hydrochlorothiazide (MICROZIDE) 12.5 MG capsule   Oral   Take 12.5 mg by mouth daily. Hold if systolic pressure is less than 110 or dystolic blood pressure is is less than 60         . levothyroxine (SYNTHROID, LEVOTHROID) 75 MCG tablet   Oral   Take 75 mcg by mouth daily.           Marland Kitchen lisinopril (PRINIVIL,ZESTRIL) 5 MG tablet  Oral   Take 5 mg by mouth daily. Takes 5 mg daily.  Hold of SBP >110 or DBP >60.         . memantine (NAMENDA) 5 MG tablet   Oral   Take 5 mg by mouth 2 (two) times daily.         . Multiple Vitamin (DAILY VITE) TABS   Oral   Take 1 tablet by mouth daily.           Marland Kitchen SALINE NASAL MIST NA   Nasal   Place 2 sprays into the nose every 4 (four) hours.         Marland Kitchen terazosin (HYTRIN) 5 MG capsule   Oral   Take 5 mg by mouth at bedtime.           . vitamin B-12 (CYANOCOBALAMIN) 1000 MCG tablet   Oral   Take 1,000 mcg by mouth once a week. Takes on thursdays.           BP 133/77  Pulse 48  Temp(Src) 97.6 F (36.4 C) (Oral)  Resp 18  SpO2 96%  Physical Exam  Nursing note and vitals reviewed. Constitutional: He appears well-developed and well-nourished.  HENT:  Head: Normocephalic and atraumatic.  Eyes: EOM are normal.  Neck: Normal range of motion.  Cardiovascular: Normal rate, regular rhythm, normal heart sounds and intact distal pulses.   Pulmonary/Chest: Effort normal and breath sounds normal. No respiratory distress.  Abdominal: Soft. He exhibits no  distension. There is no tenderness.  Musculoskeletal: Normal range of motion.  Neurological: He is alert.  Skin: Skin is warm and dry.  Psychiatric: He has a normal mood and affect. Judgment normal.    ED Course  Procedures (including critical care time)   Date: 03/18/2013  Rate: 49  Rhythm: Sinus bradycardia.    QRS Axis: normal  Intervals: normal  ST/T Wave abnormalities: normal  Conduction Disutrbances: First degree AV block  Narrative Interpretation:   Old EKG Reviewed: No significant changes noted     Labs Reviewed  CBC - Abnormal; Notable for the following:    RBC 3.99 (*)    Hemoglobin 12.6 (*)    HCT 38.3 (*)    All other components within normal limits  BASIC METABOLIC PANEL - Abnormal; Notable for the following:    CO2 33 (*)    GFR calc non Af Amer 67 (*)    GFR calc Af Amer 78 (*)    All other components within normal limits  TROPONIN I  URINALYSIS, ROUTINE W REFLEX MICROSCOPIC  LACTIC ACID, PLASMA   Dg Chest 2 View  03/18/2013  *RADIOLOGY REPORT*  Clinical Data: Altered mental status.  CHEST - 2 VIEW  Comparison: Chest x-ray 03/17/2012.  Findings: Lung volumes are low.  Ill-defined retrocardiac opacity obscuring the left hemidiaphragm, concerning for atelectasis and/or consolidation in the left lower lobe.  Possible small left pleural effusion.  Right lung is clear.  Heart size is borderline enlarged, likely accentuated by low lung volumes and lordotic positioning. Atherosclerosis of the thoracic aorta.  IMPRESSION: 1.  Low lung volumes with atelectasis and/or consolidation in the left lower lobe and possible small left pleural effusion. 2.  Atherosclerosis.   Original Report Authenticated By: Trudie Reed, M.D.    I personally reviewed the imaging tests through PACS system I reviewed available ER/hospitalization records through the EMR   1. Altered mental status       MDM  The patient is well-appearing in emergency  department.  His vitals are  normal.  His labs are normal.  He is mentating normally.  I don't think additional workup needs to be done here in the emergency department.  He was kept on telemetry and had no abnormal arrhythmias.  Discharge him with close PCP followup        Lyanne Co, MD 03/18/13 1524

## 2013-03-18 NOTE — ED Notes (Signed)
Dr. Patria Mane contacted [facility] and nurse states pt had short episode of LOC this AM while at breakfast. Cool and clammy to the touch and emesis after he became responsive.

## 2013-03-18 NOTE — ED Notes (Signed)
[id number]<AV> Expected date:03/18/13<BR> Expected time:11:56 AM<BR> Means of arrival:Ambulance<BR> Comments:<BR> AMS

## 2013-03-18 NOTE — ED Notes (Signed)
Pt placed on heart monitor at this time, heartrate 48-55 sinus bradycardia.

## 2013-03-18 NOTE — ED Notes (Signed)
Pt urinated in brief before we could get an in and out cath. Will try again and notify Patria Mane, MD.

## 2013-03-18 NOTE — ED Notes (Signed)
Nurse attempting to obtain labs with IV start 

## 2013-03-18 NOTE — ED Notes (Signed)
EMS states that nursing staff at assisted living called for pt to be transported to ED for change in mental status. Stated that pt was not following their commands at facility this AM.

## 2014-08-31 ENCOUNTER — Encounter (HOSPITAL_COMMUNITY): Payer: Self-pay | Admitting: Emergency Medicine

## 2014-08-31 ENCOUNTER — Emergency Department (HOSPITAL_COMMUNITY)
Admission: EM | Admit: 2014-08-31 | Discharge: 2014-09-01 | Disposition: A | Discharge status: Home / Self Care | Payer: Medicare Other | Attending: Emergency Medicine | Admitting: Emergency Medicine

## 2014-08-31 DIAGNOSIS — D649 Anemia, unspecified: Secondary | ICD-10-CM | POA: Insufficient documentation

## 2014-08-31 DIAGNOSIS — I10 Essential (primary) hypertension: Secondary | ICD-10-CM | POA: Insufficient documentation

## 2014-08-31 DIAGNOSIS — F0390 Unspecified dementia without behavioral disturbance: Secondary | ICD-10-CM | POA: Diagnosis not present

## 2014-08-31 DIAGNOSIS — Y92128 Other place in nursing home as the place of occurrence of the external cause: Secondary | ICD-10-CM | POA: Diagnosis not present

## 2014-08-31 DIAGNOSIS — M1990 Unspecified osteoarthritis, unspecified site: Secondary | ICD-10-CM | POA: Diagnosis not present

## 2014-08-31 DIAGNOSIS — Y9389 Activity, other specified: Secondary | ICD-10-CM | POA: Diagnosis not present

## 2014-08-31 DIAGNOSIS — Z043 Encounter for examination and observation following other accident: Secondary | ICD-10-CM | POA: Insufficient documentation

## 2014-08-31 DIAGNOSIS — Z79899 Other long term (current) drug therapy: Secondary | ICD-10-CM | POA: Diagnosis not present

## 2014-08-31 DIAGNOSIS — W1839XA Other fall on same level, initial encounter: Secondary | ICD-10-CM | POA: Diagnosis not present

## 2014-08-31 DIAGNOSIS — Z7951 Long term (current) use of inhaled steroids: Secondary | ICD-10-CM | POA: Insufficient documentation

## 2014-08-31 DIAGNOSIS — W19XXXA Unspecified fall, initial encounter: Secondary | ICD-10-CM

## 2014-08-31 NOTE — ED Notes (Signed)
Per EMS[facility], patient had a fall PTA, patient was presumed to have fallen out of bed at a lowered height. Patient denies complaints. Patient family notified by EMS and is en route at this time. Patient passed SCCA on scene. Patient is alert at baseline.

## 2014-08-31 NOTE — ED Notes (Signed)
Bed: [id number] Expected date:  Expected time:  Means of arrival:  Comments: Bed 24, EMS, [age] M, Fall

## 2014-09-01 ENCOUNTER — Other Ambulatory Visit (HOSPITAL_COMMUNITY): Payer: Medicare Other

## 2014-09-01 ENCOUNTER — Emergency Department (HOSPITAL_COMMUNITY): Payer: Medicare Other

## 2014-09-01 ENCOUNTER — Encounter (HOSPITAL_COMMUNITY): Payer: Self-pay | Admitting: Emergency Medicine

## 2014-09-01 DIAGNOSIS — Z043 Encounter for examination and observation following other accident: Secondary | ICD-10-CM | POA: Diagnosis not present

## 2014-09-01 NOTE — Discharge Instructions (Signed)

## 2014-09-01 NOTE — ED Notes (Signed)
Called [facility] (care [facility] and spoke with Latanya MaudlinLaSandra regarding Mr Winquist's treatment and condition. Explained that pt is stable, resting comfortably, was visited by relatives while in the ED, and will be transported back to the facility via non-emergent [facility] transport.

## 2014-09-01 NOTE — ED Provider Notes (Signed)
CSN: [id number]     Arrival date & time 08/31/14  2234 History   First MD Initiated Contact with Patient 08/31/14 2309     Chief Complaint  Patient presents with  . Fall    no complaints     (Consider location/radiation/quality/duration/timing/severity/associated sxs/prior Treatment) Patient is a 80 y.o. male presenting with fall. The history is provided by the patient. No language interpreter was used.  Fall This is a new problem. The current episode started 3 to 5 hours ago. The problem occurs constantly. The problem has not changed since onset.Pertinent negatives include no chest pain, no abdominal pain, no headaches and no shortness of breath. Nothing aggravates the symptoms. Nothing relieves the symptoms. He has tried nothing for the symptoms. The treatment provided no relief.    Past Medical History  Diagnosis Date  . Anemia   . Hypertension   . Dementia   . Altered mental status   . Osteoarthritis    History reviewed. No pertinent past surgical history. History reviewed. No pertinent family history. History  Substance Use Topics  . Smoking status: Smoker, Current Status Unknown  . Smokeless tobacco: Not on file  . Alcohol Use: No    Review of Systems  Respiratory: Negative for shortness of breath.   Cardiovascular: Negative for chest pain.  Gastrointestinal: Negative for abdominal pain.  Neurological: Negative for headaches.  All other systems reviewed and are negative.     Allergies  Review of patient's allergies indicates no known allergies.  Home Medications   Prior to Admission medications   Medication Sig Start Date End Date Taking? Authorizing Provider  acetaminophen (TYLENOL) 500 MG tablet Take 500 mg by mouth 3 (three) times daily.   Yes Historical Provider, MD  alum & mag hydroxide-simeth (MAALOX PLUS) 400-400-40 MG/5ML suspension Take 30 mLs by mouth every 6 (six) hours as needed for indigestion.   Yes Historical Provider, MD  cetirizine (ZYRTEC)  10 MG tablet Take 10 mg by mouth daily as needed for allergies.   Yes Historical Provider, MD  chlorhexidine (PERIDEX) 0.12 % solution Use as directed 15 mLs in the mouth or throat 2 (two) times daily.   Yes Historical Provider, MD  divalproex (DEPAKOTE) 125 MG DR tablet Take 125 mg by mouth 2 (two) times daily.   Yes Historical Provider, MD  Ensure Plus (ENSURE PLUS) LIQD Take 237 mLs by mouth 3 (three) times daily with meals.   Yes Historical Provider, MD  ferrous sulfate 325 (65 FE) MG tablet Take 325 mg by mouth daily with breakfast.   Yes Historical Provider, MD  finasteride (PROSCAR) 5 MG tablet Take 5 mg by mouth daily.     Yes Historical Provider, MD  fluticasone (FLONASE) 50 MCG/ACT nasal spray 1 spray by Nasal route daily.     Yes Historical Provider, MD  guaifenesin (ROBITUSSIN) 100 MG/5ML syrup Take 200 mg by mouth every 6 (six) hours as needed for cough.   Yes Historical Provider, MD  hydrochlorothiazide (MICROZIDE) 12.5 MG capsule Take 12.5 mg by mouth daily. Hold if systolic pressure is less than 110 or dystolic blood pressure is is less than 60   Yes Historical Provider, MD  levothyroxine (SYNTHROID, LEVOTHROID) 88 MCG tablet Take 88 mcg by mouth daily before breakfast.   Yes Historical Provider, MD  lisinopril (PRINIVIL,ZESTRIL) 10 MG tablet Take 10 mg by mouth daily.   Yes Historical Provider, MD  loperamide (IMODIUM) 2 MG capsule Take 2 mg by mouth as needed for diarrhea or loose  stools.   Yes Historical Provider, MD  magnesium hydroxide (MILK OF MAGNESIA) 400 MG/5ML suspension Take 30 mLs by mouth daily as needed for mild constipation.   Yes Historical Provider, MD  memantine (NAMENDA) 5 MG tablet Take 5 mg by mouth 2 (two) times daily.   Yes Historical Provider, MD  Multiple Vitamin (DAILY VITE) TABS Take 1 tablet by mouth daily.     Yes Historical Provider, MD  terazosin (HYTRIN) 5 MG capsule Take 5 mg by mouth at bedtime.     Yes Historical Provider, MD  SALINE NASAL MIST NA  Place 2 sprays into the nose every 4 (four) hours.    Historical Provider, MD   BP 191/106  Pulse 63  Resp 18  SpO2 95% Physical Exam  Constitutional: He is oriented to person, place, and time. He appears well-developed and well-nourished. No distress.  HENT:  Head: Normocephalic and atraumatic. Head is without raccoon's eyes and without Battle's sign.  Right Ear: No mastoid tenderness. No hemotympanum.  Left Ear: No mastoid tenderness. No hemotympanum.  Mouth/Throat: Oropharynx is clear and moist.  Eyes: Conjunctivae are normal. Pupils are equal, round, and reactive to light.  Neck: Normal range of motion. Neck supple.  Cardiovascular: Normal rate, regular rhythm and intact distal pulses.   Pulmonary/Chest: Effort normal and breath sounds normal. He has no wheezes. He has no rales.  Abdominal: Soft. Bowel sounds are normal. There is no tenderness. There is no rebound and no guarding.  Musculoskeletal: Normal range of motion.  No foreshortening or external rotation pelvis is stable   Neurological: He is alert and oriented to person, place, and time.  Skin: Skin is warm and dry.  Psychiatric: He has a normal mood and affect.    ED Course  Procedures (including critical care time) Labs Review Labs Reviewed - No data to display  Imaging Review Dg Pelvis 1-2 Views  09/01/2014   CLINICAL DATA:  Patient fell out of bed.  Initial evaluation .  EXAM: PELVIS - 1-2 VIEW  COMPARISON:  .  FINDINGS: Total right hip replacement. Degenerative changes lumbar spine and left hip. Diffuse osteopenia. Surgical clips within the pelvis. Phleboliths. No acute abnormality.  IMPRESSION: 1. No acute abnormality. 2. Right hip replacement in good anatomic alignment. 3. Degenerative changes lumbar spine and left hip.   Electronically Signed   By: Maisie Fushomas  Register   On: 09/01/2014 00:56   Ct Head Wo Contrast  09/01/2014   CLINICAL DATA:  Fall.  EXAM: CT HEAD WITHOUT CONTRAST  CT CERVICAL SPINE WITHOUT  CONTRAST  TECHNIQUE: Multidetector CT imaging of the head and cervical spine was performed following the standard protocol without intravenous contrast. Multiplanar CT image reconstructions of the cervical spine were also generated.  COMPARISON:  CT 03/08/2013 .  FINDINGS: CT HEAD FINDINGS  No intra-axial or extra-axial pathologic fluid or blood collections identified. Diffuse cerebral and cerebellar atrophy is present. No mass. Basal ganglia calcification. Ventriculomegaly noted consistent degree of atrophy. No acute bony abnormality identified.  CT CERVICAL SPINE FINDINGS  No soft tissue swelling noted. Diffuse degenerative change cervical spine. No evidence of fracture or dislocation. Carotid vascular disease.  IMPRESSION: 1. No acute intracranial abnormality. Diffuse cerebral cerebellar atrophy. 2. Carotid vascular disease. 3. Diffuse degenerative change cervical spine. No acute abnormality . No fracture or dislocation.   Electronically Signed   By: Maisie Fushomas  Register   On: 09/01/2014 00:53   Ct Cervical Spine Wo Contrast  09/01/2014   CLINICAL DATA:  Fall.  EXAM: CT HEAD WITHOUT CONTRAST  CT CERVICAL SPINE WITHOUT CONTRAST  TECHNIQUE: Multidetector CT imaging of the head and cervical spine was performed following the standard protocol without intravenous contrast. Multiplanar CT image reconstructions of the cervical spine were also generated.  COMPARISON:  CT 03/08/2013 .  FINDINGS: CT HEAD FINDINGS  No intra-axial or extra-axial pathologic fluid or blood collections identified. Diffuse cerebral and cerebellar atrophy is present. No mass. Basal ganglia calcification. Ventriculomegaly noted consistent degree of atrophy. No acute bony abnormality identified.  CT CERVICAL SPINE FINDINGS  No soft tissue swelling noted. Diffuse degenerative change cervical spine. No evidence of fracture or dislocation. Carotid vascular disease.  IMPRESSION: 1. No acute intracranial abnormality. Diffuse cerebral cerebellar atrophy.  2. Carotid vascular disease. 3. Diffuse degenerative change cervical spine. No acute abnormality . No fracture or dislocation.   Electronically Signed   By: Maisie Fushomas  Register   On: 09/01/2014 00:53     EKG Interpretation None      MDM   Final diagnoses:  None    At baseline.  Wil transfer back to patient's facility    Anmarie Fukushima K Clarance Bollard-Rasch, MD 09/01/14 [phone]

## 2014-09-01 NOTE — ED Notes (Signed)
Patient daughter and son in law at bedside. Family updated on plan of care, they are in agreement to assess patient via imaging for injury. Family leaving at this time, request a phone call to notify them if patient is admitted. Contact info:  Leota SauersJeff Hatfield (son in law) [phone]  Orie RoutSharon Hatfield (daughter) [phone]  Password: Caryl BisFATHER

## 2014-10-05 ENCOUNTER — Encounter (HOSPITAL_COMMUNITY): Payer: Self-pay | Admitting: Emergency Medicine

## 2014-10-05 ENCOUNTER — Emergency Department (HOSPITAL_COMMUNITY)
Admission: EM | Admit: 2014-10-05 | Discharge: 2014-10-06 | Disposition: A | Discharge status: Home / Self Care | Payer: Medicare Other | Attending: Emergency Medicine | Admitting: Emergency Medicine

## 2014-10-05 ENCOUNTER — Emergency Department (HOSPITAL_COMMUNITY): Payer: Medicare Other

## 2014-10-05 DIAGNOSIS — Z720 Tobacco use: Secondary | ICD-10-CM | POA: Insufficient documentation

## 2014-10-05 DIAGNOSIS — Z79899 Other long term (current) drug therapy: Secondary | ICD-10-CM | POA: Insufficient documentation

## 2014-10-05 DIAGNOSIS — Z7951 Long term (current) use of inhaled steroids: Secondary | ICD-10-CM | POA: Diagnosis not present

## 2014-10-05 DIAGNOSIS — N390 Urinary tract infection, site not specified: Secondary | ICD-10-CM | POA: Diagnosis not present

## 2014-10-05 DIAGNOSIS — R4182 Altered mental status, unspecified: Secondary | ICD-10-CM | POA: Diagnosis present

## 2014-10-05 DIAGNOSIS — D649 Anemia, unspecified: Secondary | ICD-10-CM | POA: Diagnosis not present

## 2014-10-05 DIAGNOSIS — M1990 Unspecified osteoarthritis, unspecified site: Secondary | ICD-10-CM | POA: Insufficient documentation

## 2014-10-05 DIAGNOSIS — I10 Essential (primary) hypertension: Secondary | ICD-10-CM | POA: Diagnosis not present

## 2014-10-05 DIAGNOSIS — F0390 Unspecified dementia without behavioral disturbance: Secondary | ICD-10-CM | POA: Insufficient documentation

## 2014-10-05 LAB — CBC WITH DIFFERENTIAL/PLATELET
BASOS PCT: 0 % (ref 0–1)
Basophils Absolute: 0 10*3/uL (ref 0.0–0.1)
Eosinophils Absolute: 0.1 10*3/uL (ref 0.0–0.7)
Eosinophils Relative: 2 % (ref 0–5)
HEMATOCRIT: 37.8 % — AB (ref 39.0–52.0)
Hemoglobin: 12.8 g/dL — ABNORMAL LOW (ref 13.0–17.0)
Lymphocytes Relative: 21 % (ref 12–46)
Lymphs Abs: 1 10*3/uL (ref 0.7–4.0)
MCH: 32.1 pg (ref 26.0–34.0)
MCHC: 33.9 g/dL (ref 30.0–36.0)
MCV: 94.7 fL (ref 78.0–100.0)
MONO ABS: 0.4 10*3/uL (ref 0.1–1.0)
Monocytes Relative: 10 % (ref 3–12)
Neutro Abs: 3 10*3/uL (ref 1.7–7.7)
Neutrophils Relative %: 67 % (ref 43–77)
Platelets: 169 10*3/uL (ref 150–400)
RBC: 3.99 MIL/uL — ABNORMAL LOW (ref 4.22–5.81)
RDW: 12.9 % (ref 11.5–15.5)
WBC: 4.6 10*3/uL (ref 4.0–10.5)

## 2014-10-05 LAB — URINALYSIS, ROUTINE W REFLEX MICROSCOPIC
Bilirubin Urine: NEGATIVE
Glucose, UA: NEGATIVE mg/dL
Hgb urine dipstick: NEGATIVE
KETONES UR: 15 mg/dL — AB
NITRITE: NEGATIVE
PROTEIN: NEGATIVE mg/dL
Specific Gravity, Urine: 1.019 (ref 1.005–1.030)
Urobilinogen, UA: 1 mg/dL (ref 0.0–1.0)
pH: 6.5 (ref 5.0–8.0)

## 2014-10-05 LAB — COMPREHENSIVE METABOLIC PANEL
ALT: 22 U/L (ref 0–53)
ANION GAP: 12 (ref 5–15)
AST: 26 U/L (ref 0–37)
Albumin: 3.2 g/dL — ABNORMAL LOW (ref 3.5–5.2)
Alkaline Phosphatase: 82 U/L (ref 39–117)
BILIRUBIN TOTAL: 0.4 mg/dL (ref 0.3–1.2)
BUN: 15 mg/dL (ref 6–23)
CALCIUM: 9 mg/dL (ref 8.4–10.5)
CHLORIDE: 98 meq/L (ref 96–112)
CO2: 28 mEq/L (ref 19–32)
CREATININE: 1.15 mg/dL (ref 0.50–1.35)
GFR calc Af Amer: 68 mL/min — ABNORMAL LOW (ref >90)
GFR calc non Af Amer: 59 mL/min — ABNORMAL LOW (ref >90)
Glucose, Bld: 94 mg/dL (ref 70–99)
Potassium: 3.5 mEq/L — ABNORMAL LOW (ref 3.7–5.3)
Sodium: 138 mEq/L (ref 137–147)
Total Protein: 6.8 g/dL (ref 6.0–8.3)

## 2014-10-05 LAB — URINE MICROSCOPIC-ADD ON

## 2014-10-05 LAB — CBG MONITORING, ED: GLUCOSE-CAPILLARY: 95 mg/dL (ref 70–99)

## 2014-10-05 LAB — I-STAT CG4 LACTIC ACID, ED: LACTIC ACID, VENOUS: 1 mmol/L (ref 0.5–2.2)

## 2014-10-05 MED ORDER — CIPROFLOXACIN HCL 500 MG PO TABS: 500.0000 mg | ORAL_TABLET | Freq: Two times a day (BID) | ORAL | Status: DC

## 2014-10-05 NOTE — ED Notes (Signed)
Patient is resting comfortably. 

## 2014-10-05 NOTE — ED Notes (Signed)
DC instruction and report given to nurse skill facility supervisor. Vitals WNL, NAD noticed.

## 2014-10-05 NOTE — ED Notes (Signed)
Pt returned from CT scan without distress.

## 2014-10-05 NOTE — ED Notes (Signed)
Pt from facility, staff walked in to room and pt was not responding to staff per baseline. Pt baseline is demented and not very verbal. EMS found pt to be hypotensive, 500 ml bolus made improvement. Pt is alert and verbal at triage.

## 2014-10-05 NOTE — Discharge Instructions (Signed)

## 2014-10-05 NOTE — ED Provider Notes (Signed)
CSN: [id number]     Arrival date & time 10/05/14  1854 History   First MD Initiated Contact with Patient 10/05/14 1859     Chief Complaint  Patient presents with  . Altered Mental Status     (Consider location/radiation/quality/duration/timing/severity/associated sxs/prior Treatment) HPI Comments: Patient presents with altered mental status. He has a history of dementia and history is obtained only per EMS  And nursing report. He apparently has been his baseline mental status today until the staff went to check on him this evening. They've noticed him to be not responding to verbal stimuli. Patient noted him to be hypotensive on arrival. He was given a 500 cc fluid pain bolus and he became more responsive. Apparently this point he is at his baseline mental status. There is no reported history of seizure activity or recent falls.  Patient is a 80 y.o. male presenting with altered mental status.  Altered Mental Status   Past Medical History  Diagnosis Date  . Anemia   . Hypertension   . Dementia   . Altered mental status   . Osteoarthritis    History reviewed. No pertinent past surgical history. History reviewed. No pertinent family history. History  Substance Use Topics  . Smoking status: Smoker, Current Status Unknown  . Smokeless tobacco: Not on file  . Alcohol Use: No    Review of Systems  Unable to perform ROS     Allergies  Review of patient's allergies indicates no known allergies.  Home Medications   Prior to Admission medications   Medication Sig Start Date End Date Taking? Authorizing Provider  acetaminophen (TYLENOL) 500 MG tablet Take 500 mg by mouth 3 (three) times daily.   Yes Historical Provider, MD  alum & mag hydroxide-simeth (MAALOX PLUS) 400-400-40 MG/5ML suspension Take 30 mLs by mouth every 6 (six) hours as needed for indigestion.   Yes Historical Provider, MD  cetirizine (ZYRTEC) 10 MG tablet Take 10 mg by mouth daily as needed for allergies.   Yes  Historical Provider, MD  chlorhexidine (PERIDEX) 0.12 % solution Use as directed 15 mLs in the mouth or throat 2 (two) times daily.   Yes Historical Provider, MD  divalproex (DEPAKOTE) 125 MG DR tablet Take 125 mg by mouth 2 (two) times daily.   Yes Historical Provider, MD  Ensure Plus (ENSURE PLUS) LIQD Take 237 mLs by mouth 3 (three) times daily with meals.   Yes Historical Provider, MD  ferrous sulfate 325 (65 FE) MG tablet Take 325 mg by mouth daily with breakfast.   Yes Historical Provider, MD  finasteride (PROSCAR) 5 MG tablet Take 5 mg by mouth daily.     Yes Historical Provider, MD  fluticasone (FLONASE) 50 MCG/ACT nasal spray 1 spray by Nasal route daily.     Yes Historical Provider, MD  FLUZONE HIGH-DOSE 0.5 ML SUSY Inject 1 Dose as directed once. 09/21/14  Yes Historical Provider, MD  guaifenesin (ROBITUSSIN) 100 MG/5ML syrup Take 200 mg by mouth every 6 (six) hours as needed for cough.   Yes Historical Provider, MD  hydrochlorothiazide (MICROZIDE) 12.5 MG capsule Take 12.5 mg by mouth daily. Hold if systolic pressure is less than 110 or dystolic blood pressure is is less than 60   Yes Historical Provider, MD  levothyroxine (SYNTHROID, LEVOTHROID) 88 MCG tablet Take 88 mcg by mouth daily before breakfast.   Yes Historical Provider, MD  lisinopril (PRINIVIL,ZESTRIL) 10 MG tablet Take 10 mg by mouth daily.   Yes Historical Provider, MD  loperamide (IMODIUM) 2 MG capsule Take 2 mg by mouth as needed for diarrhea or loose stools.   Yes Historical Provider, MD  magnesium hydroxide (MILK OF MAGNESIA) 400 MG/5ML suspension Take 30 mLs by mouth daily as needed for mild constipation.   Yes Historical Provider, MD  memantine (NAMENDA) 5 MG tablet Take 5 mg by mouth 2 (two) times daily.   Yes Historical Provider, MD  Multiple Vitamin (DAILY VITE) TABS Take 1 tablet by mouth daily.     Yes Historical Provider, MD  SALINE NASAL MIST NA Place 2 sprays into the nose every 4 (four) hours.   Yes Historical  Provider, MD  terazosin (HYTRIN) 5 MG capsule Take 5 mg by mouth at bedtime.     Yes Historical Provider, MD  ciprofloxacin (CIPRO) 500 MG tablet Take 1 tablet (500 mg total) by mouth 2 (two) times daily. One po bid x 7 days 10/05/14   Rolan Bucco, MD   BP 142/89 mmHg  Pulse 71  Temp(Src) 97.5 F (36.4 C) (Oral)  Resp 17  SpO2 95% Physical Exam  Constitutional: He appears well-developed and well-nourished.  HENT:  Head: Normocephalic and atraumatic.  Eyes: Pupils are equal, round, and reactive to light.  Pupils 2mm bilaterally  Neck: Normal range of motion. Neck supple.  Cardiovascular: Normal rate, regular rhythm and normal heart sounds.   Pulmonary/Chest: Effort normal and breath sounds normal. No respiratory distress. He has no wheezes. He has no rales. He exhibits no tenderness.  Abdominal: Soft. Bowel sounds are normal. There is no tenderness. There is no rebound and no guarding.  Musculoskeletal: Normal range of motion. He exhibits no edema.  Lymphadenopathy:    He has no cervical adenopathy.  Neurological:  Opens eyes to verbal stimuli.  No facial drooping.  Contractures noted to upper extremities.  Seems to have stronger grip on left.  downgoing toes on babinski  Skin: Skin is warm and dry. No rash noted.  Psychiatric: He has a normal mood and affect.    ED Course  Procedures (including critical care time) Labs Review Results for orders placed or performed during the hospital encounter of 10/05/14  CBC with Differential  Result Value Ref Range   WBC 4.6 4.0 - 10.5 K/uL   RBC 3.99 (L) 4.22 - 5.81 MIL/uL   Hemoglobin 12.8 (L) 13.0 - 17.0 g/dL   HCT 13.0 (L) 86.5 - 78.4 %   MCV 94.7 78.0 - 100.0 fL   MCH 32.1 26.0 - 34.0 pg   MCHC 33.9 30.0 - 36.0 g/dL   RDW 69.6 29.5 - 28.4 %   Platelets 169 150 - 400 K/uL   Neutrophils Relative % 67 43 - 77 %   Neutro Abs 3.0 1.7 - 7.7 K/uL   Lymphocytes Relative 21 12 - 46 %   Lymphs Abs 1.0 0.7 - 4.0 K/uL   Monocytes  Relative 10 3 - 12 %   Monocytes Absolute 0.4 0.1 - 1.0 K/uL   Eosinophils Relative 2 0 - 5 %   Eosinophils Absolute 0.1 0.0 - 0.7 K/uL   Basophils Relative 0 0 - 1 %   Basophils Absolute 0.0 0.0 - 0.1 K/uL  Comprehensive metabolic panel  Result Value Ref Range   Sodium 138 137 - 147 mEq/L   Potassium 3.5 (L) 3.7 - 5.3 mEq/L   Chloride 98 96 - 112 mEq/L   CO2 28 19 - 32 mEq/L   Glucose, Bld 94 70 - 99 mg/dL   BUN 15  6 - 23 mg/dL   Creatinine, Ser 6.96 0.50 - 1.35 mg/dL   Calcium 9.0 8.4 - 29.5 mg/dL   Total Protein 6.8 6.0 - 8.3 g/dL   Albumin 3.2 (L) 3.5 - 5.2 g/dL   AST 26 0 - 37 U/L   ALT 22 0 - 53 U/L   Alkaline Phosphatase 82 39 - 117 U/L   Total Bilirubin 0.4 0.3 - 1.2 mg/dL   GFR calc non Af Amer 59 (L) >90 mL/min   GFR calc Af Amer 68 (L) >90 mL/min   Anion gap 12 5 - 15  Urinalysis, Routine w reflex microscopic  Result Value Ref Range   Color, Urine YELLOW YELLOW   APPearance CLOUDY (A) CLEAR   Specific Gravity, Urine 1.019 1.005 - 1.030   pH 6.5 5.0 - 8.0   Glucose, UA NEGATIVE NEGATIVE mg/dL   Hgb urine dipstick NEGATIVE NEGATIVE   Bilirubin Urine NEGATIVE NEGATIVE   Ketones, ur 15 (A) NEGATIVE mg/dL   Protein, ur NEGATIVE NEGATIVE mg/dL   Urobilinogen, UA 1.0 0.0 - 1.0 mg/dL   Nitrite NEGATIVE NEGATIVE   Leukocytes, UA SMALL (A) NEGATIVE  Urine microscopic-add on  Result Value Ref Range   Squamous Epithelial / LPF RARE RARE   WBC, UA 3-6 <3 WBC/hpf   Bacteria, UA MANY (A) RARE   Casts HYALINE CASTS (A) NEGATIVE   Urine-Other AMORPHOUS URATES/PHOSPHATES   CBG monitoring, ED  Result Value Ref Range   Glucose-Capillary 95 70 - 99 mg/dL  I-Stat CG4 Lactic Acid, ED  Result Value Ref Range   Lactic Acid, Venous 1.00 0.5 - 2.2 mmol/L   Ct Head Wo Contrast  10/05/2014   CLINICAL DATA:  Mental status changes.  Dementia.  EXAM: CT HEAD WITHOUT CONTRAST  TECHNIQUE: Contiguous axial images were obtained from the base of the skull through the vertex without  intravenous contrast.  COMPARISON:  09/01/2014  FINDINGS: Sinuses/Soft tissues: Clear paranasal sinuses and mastoid air cells.  Intracranial: Advanced cerebral atrophy. Mild low density in the periventricular white matter likely related to small vessel disease. Artifact versus remote lacunar infarct in the right paracentral pons. No mass lesion, hemorrhage, hydrocephalus, acute infarct, intra-axial, or extra-axial fluid collection.  IMPRESSION: 1.  No acute intracranial abnormality. 2.  Cerebral atrophy and small vessel ischemic change.   Electronically Signed   By: Jeronimo Greaves M.D.   On: 10/05/2014 20:33      Imaging Review Ct Head Wo Contrast  10/05/2014   CLINICAL DATA:  Mental status changes.  Dementia.  EXAM: CT HEAD WITHOUT CONTRAST  TECHNIQUE: Contiguous axial images were obtained from the base of the skull through the vertex without intravenous contrast.  COMPARISON:  09/01/2014  FINDINGS: Sinuses/Soft tissues: Clear paranasal sinuses and mastoid air cells.  Intracranial: Advanced cerebral atrophy. Mild low density in the periventricular white matter likely related to small vessel disease. Artifact versus remote lacunar infarct in the right paracentral pons. No mass lesion, hemorrhage, hydrocephalus, acute infarct, intra-axial, or extra-axial fluid collection.  IMPRESSION: 1.  No acute intracranial abnormality. 2.  Cerebral atrophy and small vessel ischemic change.   Electronically Signed   By: Jeronimo Greaves M.D.   On: 10/05/2014 20:33     EKG Interpretation   Date/Time:  Friday October 05 2014 19:43:32 EST Ventricular Rate:  70 PR Interval:  251 QRS Duration: 88 QT Interval:  439 QTC Calculation: 474 R Axis:   -14 Text Interpretation:  Sinus rhythm Prolonged PR interval t wave flattening  laterally, otherwise unchanged from prior EKG Confirmed by Eleazar Kimmey  MD,  Iveliz Garay ([id number]) on 10/05/2014 8:15:20 PM      MDM   Final diagnoses:  UTI (lower urinary tract infection)    Pt  presents after an episode of AMS.  Had some hypotension in the field, but none in the ED.  Lactate normal.  At reported baseline MS now.  Will tx for UTI.  CT neg.  Other labs unremarkable.  No evidence of seizure, CVA.    Rolan BuccoMelanie Marbin Olshefski, MD 10/05/14 [phone]

## 2014-10-07 LAB — URINE CULTURE

## 2014-10-08 ENCOUNTER — Telehealth (HOSPITAL_COMMUNITY): Payer: Self-pay

## 2014-10-08 NOTE — Progress Notes (Signed)
ED Antimicrobial Stewardship Positive Culture Follow Up   Richard Booth is an 80 y.o. male who [facility] with a chief complaint of  Chief Complaint  Patient presents with  . Altered Mental Status    Recent Results (from the past 720 hour(s))  Urine culture     Status: None   Collection Time: 10/05/14  9:31 PM  Result Value Ref Range Status   Specimen Description URINE, CATHETERIZED  Final   Special Requests NONE  Final   Culture  Setup Time   Final    10/06/2014 04:25 Performed at [facility]    Colony Count   Final    >=100,000 COLONIES/ML Performed at [facility]    Culture   Final    VIRIDANS STREPTOCOCCUS Performed at [facility]    Report Status 10/07/2014 FINAL  Final    [x]  Treated with cipro, organism resistant to prescribed antimicrobial  New antibiotic prescription: Stop ciprofloxacin. Start Keflex 500 mg twice daily for 10 days  ED Provider: Dierdre ForthHannah Muthersbaugh, PA-C   Almon HerculesBaird, Tayllor Breitenstein P 10/08/2014, 9:19 AM Infectious Diseases Pharmacist Phone# [phone]

## 2014-10-08 NOTE — ED Notes (Signed)
pt has changed nursing homes. unknown new location

## 2014-10-10 ENCOUNTER — Telehealth (HOSPITAL_BASED_OUTPATIENT_CLINIC_OR_DEPARTMENT_OTHER): Payer: Self-pay | Admitting: Emergency Medicine

## 2014-10-10 NOTE — Telephone Encounter (Signed)
Attempts to reach patient/caregivers regarding labwork from 10/06/14, address at time of visit was at SNF, patient no longer at that SNF, unable to locate pt, attempts to contact wife and EC at listed numbers were invalid numbers, unable to do followup via letter to no known address, lost to followup

## 2014-11-29 ENCOUNTER — Emergency Department (HOSPITAL_COMMUNITY): Payer: Medicare Other

## 2014-11-29 ENCOUNTER — Emergency Department (HOSPITAL_COMMUNITY)
Admission: EM | Admit: 2014-11-29 | Discharge: 2014-11-29 | Disposition: A | Discharge status: Home / Self Care | Payer: Medicare Other | Attending: Emergency Medicine | Admitting: Emergency Medicine

## 2014-11-29 ENCOUNTER — Encounter (HOSPITAL_COMMUNITY): Payer: Self-pay | Admitting: Emergency Medicine

## 2014-11-29 DIAGNOSIS — D649 Anemia, unspecified: Secondary | ICD-10-CM | POA: Insufficient documentation

## 2014-11-29 DIAGNOSIS — Z720 Tobacco use: Secondary | ICD-10-CM | POA: Diagnosis not present

## 2014-11-29 DIAGNOSIS — I10 Essential (primary) hypertension: Secondary | ICD-10-CM | POA: Diagnosis not present

## 2014-11-29 DIAGNOSIS — M1990 Unspecified osteoarthritis, unspecified site: Secondary | ICD-10-CM | POA: Insufficient documentation

## 2014-11-29 DIAGNOSIS — R404 Transient alteration of awareness: Secondary | ICD-10-CM

## 2014-11-29 DIAGNOSIS — N390 Urinary tract infection, site not specified: Secondary | ICD-10-CM | POA: Insufficient documentation

## 2014-11-29 DIAGNOSIS — Z7951 Long term (current) use of inhaled steroids: Secondary | ICD-10-CM | POA: Insufficient documentation

## 2014-11-29 DIAGNOSIS — F0390 Unspecified dementia without behavioral disturbance: Secondary | ICD-10-CM | POA: Insufficient documentation

## 2014-11-29 DIAGNOSIS — R4182 Altered mental status, unspecified: Secondary | ICD-10-CM | POA: Diagnosis present

## 2014-11-29 DIAGNOSIS — Z79899 Other long term (current) drug therapy: Secondary | ICD-10-CM | POA: Diagnosis not present

## 2014-11-29 LAB — CBC WITH DIFFERENTIAL/PLATELET
Basophils Absolute: 0 10*3/uL (ref 0.0–0.1)
Basophils Relative: 0 % (ref 0–1)
EOS ABS: 0 10*3/uL (ref 0.0–0.7)
EOS PCT: 1 % (ref 0–5)
HCT: 39.7 % (ref 39.0–52.0)
Hemoglobin: 13.3 g/dL (ref 13.0–17.0)
LYMPHS ABS: 1.2 10*3/uL (ref 0.7–4.0)
Lymphocytes Relative: 22 % (ref 12–46)
MCH: 32.2 pg (ref 26.0–34.0)
MCHC: 33.5 g/dL (ref 30.0–36.0)
MCV: 96.1 fL (ref 78.0–100.0)
MONO ABS: 0.6 10*3/uL (ref 0.1–1.0)
MONOS PCT: 11 % (ref 3–12)
Neutro Abs: 3.5 10*3/uL (ref 1.7–7.7)
Neutrophils Relative %: 66 % (ref 43–77)
Platelets: 175 10*3/uL (ref 150–400)
RBC: 4.13 MIL/uL — ABNORMAL LOW (ref 4.22–5.81)
RDW: 12.8 % (ref 11.5–15.5)
WBC: 5.3 10*3/uL (ref 4.0–10.5)

## 2014-11-29 LAB — URINALYSIS, ROUTINE W REFLEX MICROSCOPIC
BILIRUBIN URINE: NEGATIVE
GLUCOSE, UA: NEGATIVE mg/dL
HGB URINE DIPSTICK: NEGATIVE
Ketones, ur: NEGATIVE mg/dL
Nitrite: POSITIVE — AB
PROTEIN: NEGATIVE mg/dL
Specific Gravity, Urine: 1.018 (ref 1.005–1.030)
Urobilinogen, UA: 2 mg/dL — ABNORMAL HIGH (ref 0.0–1.0)
pH: 6.5 (ref 5.0–8.0)

## 2014-11-29 LAB — COMPREHENSIVE METABOLIC PANEL
ALT: 22 U/L (ref 0–53)
AST: 34 U/L (ref 0–37)
Albumin: 3.7 g/dL (ref 3.5–5.2)
Alkaline Phosphatase: 74 U/L (ref 39–117)
Anion gap: 11 (ref 5–15)
BUN: 18 mg/dL (ref 6–23)
CO2: 30 mmol/L (ref 19–32)
CREATININE: 1.21 mg/dL (ref 0.50–1.35)
Calcium: 9.3 mg/dL (ref 8.4–10.5)
Chloride: 93 mEq/L — ABNORMAL LOW (ref 96–112)
GFR calc Af Amer: 64 mL/min — ABNORMAL LOW (ref >90)
GFR calc non Af Amer: 56 mL/min — ABNORMAL LOW (ref >90)
Glucose, Bld: 106 mg/dL — ABNORMAL HIGH (ref 70–99)
Potassium: 3.7 mmol/L (ref 3.5–5.1)
Sodium: 134 mmol/L — ABNORMAL LOW (ref 135–145)
Total Bilirubin: 0.5 mg/dL (ref 0.3–1.2)
Total Protein: 6.8 g/dL (ref 6.0–8.3)

## 2014-11-29 LAB — URINE MICROSCOPIC-ADD ON

## 2014-11-29 LAB — I-STAT CG4 LACTIC ACID, ED: Lactic Acid, Venous: 2.77 mmol/L — ABNORMAL HIGH (ref 0.5–2.2)

## 2014-11-29 LAB — TROPONIN I: Troponin I: <0.03 ng/mL (ref <0.031)

## 2014-11-29 MED ORDER — CEPHALEXIN 500 MG PO CAPS: 500.0000 mg | ORAL_CAPSULE | Freq: Four times a day (QID) | ORAL | Status: DC

## 2014-11-29 MED ORDER — SODIUM CHLORIDE 0.9 % IV SOLN
Freq: Once | INTRAVENOUS | Status: AC
  Administered 2014-11-29: 18:00:00 via INTRAVENOUS

## 2014-11-29 MED ORDER — SODIUM CHLORIDE 0.9 % IV BOLUS (SEPSIS)
500.0000 mL | Freq: Once | INTRAVENOUS | Status: AC
  Administered 2014-11-29: 500 mL via INTRAVENOUS

## 2014-11-29 MED ORDER — DEXTROSE 5 % IV SOLN
1.0000 g | Freq: Once | INTRAVENOUS | Status: AC
  Administered 2014-11-29: 1 g via INTRAVENOUS
  Filled 2014-11-29: qty 10

## 2014-11-29 NOTE — Progress Notes (Signed)
CSW attempted to meet with patient at bedside. Patient is not communicative. Per notes, [facility] and  is shaking more than normal.  Trish MageBrittney Aalia Greulich, LCSWA [phone] ED CSW 11/29/2014 8:55 PM

## 2014-11-29 NOTE — Discharge Instructions (Signed)

## 2014-11-29 NOTE — ED Notes (Signed)
Per EMS: [facility], EMS called out for unresponsive. Pt is not responding to name being called which is normal and is claw gripping his penis. Staff states pt is shaking more than normal.

## 2014-11-29 NOTE — ED Notes (Signed)
[facility] called for transport.  

## 2014-11-29 NOTE — ED Provider Notes (Addendum)
CSN: [id number]     Arrival date & time 11/29/14  1659 History   First MD Initiated Contact with Patient 11/29/14 1700     Chief Complaint  Patient presents with  . Altered Mental Status     HPI  Patient presents for evaluation of "less active". [facility] [employer] [facility].  Transferred here because "less active. We are able to speak with the provider there ultimately. Normally he is "total care" he rarely speaks. He requires assistance with "all of his ADLs". No changes appetite. No fevers chills cough has not been less alert or showing any decreased level of consciousness to the report. He has been awake and around "just less active with his activities".  Past Medical History  Diagnosis Date  . Anemia   . Hypertension   . Dementia   . Altered mental status   . Osteoarthritis    History reviewed. No pertinent past surgical history. History reviewed. No pertinent family history. History  Substance Use Topics  . Smoking status: Smoker, Current Status Unknown  . Smokeless tobacco: Not on file  . Alcohol Use: No    Review of Systems  Unable to perform ROS: Dementia      Allergies  Review of patient's allergies indicates no known allergies.  Home Medications   Prior to Admission medications   Medication Sig Start Date End Date Taking? Authorizing Provider  acetaminophen (TYLENOL) 500 MG tablet Take 500 mg by mouth 3 (three) times daily. Or every 4 hours as needed for fever or headache/minor discomfort   Yes Historical Provider, MD  alum & mag hydroxide-simeth (MAALOX PLUS) 400-400-40 MG/5ML suspension Take 30 mLs by mouth every 6 (six) hours as needed for indigestion.   Yes Historical Provider, MD  cetirizine (ZYRTEC) 10 MG tablet Take 10 mg by mouth daily as needed for allergies.   Yes Historical Provider, MD  chlorhexidine (PERIDEX) 0.12 % solution Use as directed 15 mLs in the mouth or throat 2 (two) times daily.   Yes Historical Provider, MD   divalproex (DEPAKOTE) 125 MG DR tablet Take 125 mg by mouth 2 (two) times daily.   Yes Historical Provider, MD  Ensure Plus (ENSURE PLUS) LIQD Take 237 mLs by mouth 3 (three) times daily with meals.   Yes Historical Provider, MD  ferrous sulfate 325 (65 FE) MG tablet Take 325 mg by mouth daily with breakfast.   Yes Historical Provider, MD  finasteride (PROSCAR) 5 MG tablet Take 5 mg by mouth daily.     Yes Historical Provider, MD  fluticasone (FLONASE) 50 MCG/ACT nasal spray 1 spray by Nasal route daily.     Yes Historical Provider, MD  guaifenesin (ROBITUSSIN) 100 MG/5ML syrup Take 200 mg by mouth every 6 (six) hours as needed for cough.   Yes Historical Provider, MD  hydrochlorothiazide (MICROZIDE) 12.5 MG capsule Take 12.5 mg by mouth daily. Hold if systolic pressure is less than 110 or dystolic blood pressure is is less than 60   Yes Historical Provider, MD  levothyroxine (SYNTHROID, LEVOTHROID) 88 MCG tablet Take 88 mcg by mouth daily before breakfast.   Yes Historical Provider, MD  lisinopril (PRINIVIL,ZESTRIL) 10 MG tablet Take 10 mg by mouth daily.   Yes Historical Provider, MD  loperamide (IMODIUM) 2 MG capsule Take 2 mg by mouth as needed for diarrhea or loose stools.   Yes Historical Provider, MD  magnesium hydroxide (MILK OF MAGNESIA) 400 MG/5ML suspension Take 30 mLs by mouth daily as needed  for mild constipation.   Yes Historical Provider, MD  memantine (NAMENDA) 5 MG tablet Take 5 mg by mouth 2 (two) times daily.   Yes Historical Provider, MD  Multiple Vitamin (DAILY VITE) TABS Take 1 tablet by mouth daily.     Yes Historical Provider, MD  neomycin-bacitracin-polymyxin (NEOSPORIN) [phone] ointment Apply 1 application topically daily as needed (skin abrasions or minor tears).   Yes Historical Provider, MD  sodium chloride (OCEAN) 0.65 % SOLN nasal spray Place 2 sprays into both nostrils every 4 (four) hours as needed for congestion.   Yes Historical Provider, MD  terazosin (HYTRIN)  5 MG capsule Take 5 mg by mouth at bedtime.     Yes Historical Provider, MD  cephALEXin (KEFLEX) 500 MG capsule Take 1 capsule (500 mg total) by mouth 4 (four) times daily. 11/29/14   Rolland Porter, MD  ciprofloxacin (CIPRO) 500 MG tablet Take 1 tablet (500 mg total) by mouth 2 (two) times daily. One po bid x 7 days Patient not taking: Reported on 11/29/2014 10/05/14   Rolan Bucco, MD   BP 136/75 mmHg  Pulse 89  Temp(Src) 98 F (36.7 C) (Axillary)  SpO2 98% Physical Exam  Constitutional: He appears well-developed and well-nourished. No distress.  HENT:  Head: Normocephalic.  Eyes: Conjunctivae are normal. Pupils are equal, round, and reactive to light. No scleral icterus.  Neck: Normal range of motion. Neck supple. No thyromegaly present.  Cardiovascular: Normal rate and regular rhythm.  Exam reveals no gallop and no friction rub.   No murmur heard. Pulmonary/Chest: Effort normal and breath sounds normal. No respiratory distress. He has no wheezes. He has no rales.  Abdominal: Soft. Bowel sounds are normal. He exhibits no distension. There is no tenderness. There is no rebound.  Musculoskeletal: Normal range of motion.  Neurological:  Confused. Nonfocal.  Skin: Skin is warm and dry. No rash noted.  Psychiatric: He has a normal mood and affect. His behavior is normal.    ED Course  Procedures (including critical care time) Labs Review Labs Reviewed  CBC WITH DIFFERENTIAL - Abnormal; Notable for the following:    RBC 4.13 (*)    All other components within normal limits  COMPREHENSIVE METABOLIC PANEL - Abnormal; Notable for the following:    Sodium 134 (*)    Chloride 93 (*)    Glucose, Bld 106 (*)    GFR calc non Af Amer 56 (*)    GFR calc Af Amer 64 (*)    All other components within normal limits  URINALYSIS, ROUTINE W REFLEX MICROSCOPIC - Abnormal; Notable for the following:    Color, Urine AMBER (*)    Urobilinogen, UA 2.0 (*)    Nitrite POSITIVE (*)    Leukocytes, UA  MODERATE (*)    All other components within normal limits  URINE MICROSCOPIC-ADD ON - Abnormal; Notable for the following:    Bacteria, UA MANY (*)    All other components within normal limits  I-STAT CG4 LACTIC ACID, ED - Abnormal; Notable for the following:    Lactic Acid, Venous 2.77 (*)    All other components within normal limits  TROPONIN I    Imaging Review Ct Head Wo Contrast  11/29/2014   CLINICAL DATA:  Altered mental status, unresponsive.  EXAM: CT HEAD WITHOUT CONTRAST  TECHNIQUE: Contiguous axial images were obtained from the base of the skull through the vertex without intravenous contrast.  COMPARISON:  10/05/2014  FINDINGS: No intracranial hemorrhage. Diffuse atrophy and chronic small  vessel ischemia, unchanged from prior exam. Ventricles are unchanged in size. There is no subdural fluid collection. No findings of territorial infarct. Calvarium is intact. Included paranasal sinuses and mastoid air cells are well aerated.  IMPRESSION: 1.  No acute intracranial abnormality. 2. Stable atrophy and chronic small vessel ischemic change.   Electronically Signed   By: Rubye Oaks M.D.   On: 11/29/2014 18:54   Dg Chest Port 1 View  11/29/2014   CLINICAL DATA:  Altered mental status.  Increased shaking.  EXAM: PORTABLE CHEST - 1 VIEW  COMPARISON:  03/18/2013, 03/17/2012  FINDINGS: The cardiomediastinal contours are unchanged with borderline cardiomegaly. Atherosclerosis of the aortic arch is unchanged. There is again seen blunting of left costophrenic angle, likely related to scarring. Decreased atelectasis in the left lower lobe. The right lung is clear. Pulmonary vasculature is normal. No acute osseous abnormalities.  IMPRESSION: 1.  No acute pulmonary process. 2. Blunting of left costophrenic angle, unchanged from multiple priors and likely related to scarring.   Electronically Signed   By: Rubye Oaks M.D.   On: 11/29/2014 17:41     EKG Interpretation None      MDM   Final  diagnoses:  Altered level of consciousness  UTI (lower urinary tract infection)   I discussed the case with the patient's caregiver at [facility]. She stated that he is "total care". Normally he does not walk. States that normally he does not feed himself or toxic. Node consider medicine baseline here. When asked what his change in symptoms were that elicited the ER visit she stated "he was just a little different".  Review of his most recent UTI and culture. Placed on Keflex with attention to his most recent UTI. Culture pending. I think is appropriate for discharge back to his care facility. Given IV Rocephin here.    Rolland Porter, MD 11/29/14 1954  Rolland Porter, MD 12/15/14 [phone]

## 2014-11-29 NOTE — ED Notes (Signed)
[facility]. Phone was answered and never transferred and then the phone line would cut off.

## 2014-11-29 NOTE — ED Notes (Signed)
Bed: WA02 Expected date:  Expected time:  Means of arrival:  Comments: Ems 

## 2014-12-22 ENCOUNTER — Emergency Department (HOSPITAL_COMMUNITY)
Admission: EM | Admit: 2014-12-22 | Discharge: 2014-12-22 | Disposition: A | Discharge status: Home / Self Care | Payer: Medicare Other | Attending: Emergency Medicine | Admitting: Emergency Medicine

## 2014-12-22 ENCOUNTER — Encounter (HOSPITAL_COMMUNITY): Payer: Self-pay | Admitting: Emergency Medicine

## 2014-12-22 ENCOUNTER — Emergency Department (HOSPITAL_COMMUNITY): Payer: Medicare Other

## 2014-12-22 DIAGNOSIS — W1839XA Other fall on same level, initial encounter: Secondary | ICD-10-CM | POA: Insufficient documentation

## 2014-12-22 DIAGNOSIS — M1990 Unspecified osteoarthritis, unspecified site: Secondary | ICD-10-CM | POA: Insufficient documentation

## 2014-12-22 DIAGNOSIS — N401 Enlarged prostate with lower urinary tract symptoms: Secondary | ICD-10-CM | POA: Insufficient documentation

## 2014-12-22 DIAGNOSIS — Y9389 Activity, other specified: Secondary | ICD-10-CM | POA: Diagnosis not present

## 2014-12-22 DIAGNOSIS — Y998 Other external cause status: Secondary | ICD-10-CM | POA: Diagnosis not present

## 2014-12-22 DIAGNOSIS — Z23 Encounter for immunization: Secondary | ICD-10-CM | POA: Diagnosis not present

## 2014-12-22 DIAGNOSIS — Y92129 Unspecified place in nursing home as the place of occurrence of the external cause: Secondary | ICD-10-CM | POA: Diagnosis not present

## 2014-12-22 DIAGNOSIS — N390 Urinary tract infection, site not specified: Secondary | ICD-10-CM | POA: Diagnosis not present

## 2014-12-22 DIAGNOSIS — S0181XA Laceration without foreign body of other part of head, initial encounter: Secondary | ICD-10-CM | POA: Diagnosis present

## 2014-12-22 DIAGNOSIS — E871 Hypo-osmolality and hyponatremia: Secondary | ICD-10-CM | POA: Diagnosis not present

## 2014-12-22 DIAGNOSIS — W19XXXA Unspecified fall, initial encounter: Secondary | ICD-10-CM

## 2014-12-22 DIAGNOSIS — Z79899 Other long term (current) drug therapy: Secondary | ICD-10-CM | POA: Insufficient documentation

## 2014-12-22 DIAGNOSIS — S01112A Laceration without foreign body of left eyelid and periocular area, initial encounter: Secondary | ICD-10-CM | POA: Insufficient documentation

## 2014-12-22 DIAGNOSIS — G309 Alzheimer's disease, unspecified: Secondary | ICD-10-CM | POA: Diagnosis not present

## 2014-12-22 DIAGNOSIS — F0280 Dementia in other diseases classified elsewhere without behavioral disturbance: Secondary | ICD-10-CM | POA: Diagnosis not present

## 2014-12-22 DIAGNOSIS — E878 Other disorders of electrolyte and fluid balance, not elsewhere classified: Secondary | ICD-10-CM | POA: Diagnosis not present

## 2014-12-22 DIAGNOSIS — D649 Anemia, unspecified: Secondary | ICD-10-CM | POA: Insufficient documentation

## 2014-12-22 DIAGNOSIS — Z720 Tobacco use: Secondary | ICD-10-CM | POA: Insufficient documentation

## 2014-12-22 DIAGNOSIS — I10 Essential (primary) hypertension: Secondary | ICD-10-CM | POA: Diagnosis not present

## 2014-12-22 DIAGNOSIS — E039 Hypothyroidism, unspecified: Secondary | ICD-10-CM | POA: Diagnosis not present

## 2014-12-22 DIAGNOSIS — Z7951 Long term (current) use of inhaled steroids: Secondary | ICD-10-CM | POA: Diagnosis not present

## 2014-12-22 DIAGNOSIS — E876 Hypokalemia: Secondary | ICD-10-CM

## 2014-12-22 HISTORY — DX: Nicotine dependence, unspecified, uncomplicated: F17.200

## 2014-12-22 HISTORY — DX: Edema, unspecified: R60.9

## 2014-12-22 HISTORY — DX: Hypothyroidism, unspecified: E03.9

## 2014-12-22 HISTORY — DX: Unspecified urinary incontinence: R32

## 2014-12-22 HISTORY — DX: Vitamin B deficiency, unspecified: E53.9

## 2014-12-22 HISTORY — DX: Benign prostatic hyperplasia with lower urinary tract symptoms: N40.1

## 2014-12-22 HISTORY — DX: Alzheimer's disease, unspecified: G30.9

## 2014-12-22 HISTORY — DX: Allergic rhinitis, unspecified: J30.9

## 2014-12-22 HISTORY — DX: Dementia in other diseases classified elsewhere, unspecified severity, without behavioral disturbance, psychotic disturbance, mood disturbance, and anxiety: F02.80

## 2014-12-22 HISTORY — DX: Elevated prostate specific antigen (PSA): R97.20

## 2014-12-22 HISTORY — DX: Other obstructive and reflux uropathy: N13.8

## 2014-12-22 LAB — BASIC METABOLIC PANEL
Anion gap: 7 (ref 5–15)
BUN: 18 mg/dL (ref 6–23)
CALCIUM: 8.8 mg/dL (ref 8.4–10.5)
CHLORIDE: 94 mmol/L — AB (ref 96–112)
CO2: 30 mmol/L (ref 19–32)
CREATININE: 0.86 mg/dL (ref 0.50–1.35)
GFR calc Af Amer: >90 mL/min (ref >90)
GFR calc non Af Amer: 81 mL/min — ABNORMAL LOW (ref >90)
Glucose, Bld: 98 mg/dL (ref 70–99)
Potassium: 3.4 mmol/L — ABNORMAL LOW (ref 3.5–5.1)
Sodium: 131 mmol/L — ABNORMAL LOW (ref 135–145)

## 2014-12-22 LAB — CBC WITH DIFFERENTIAL/PLATELET
BASOS PCT: 0 % (ref 0–1)
Basophils Absolute: 0 10*3/uL (ref 0.0–0.1)
Eosinophils Absolute: 0.1 10*3/uL (ref 0.0–0.7)
Eosinophils Relative: 2 % (ref 0–5)
HCT: 37.5 % — ABNORMAL LOW (ref 39.0–52.0)
HEMOGLOBIN: 12.8 g/dL — AB (ref 13.0–17.0)
LYMPHS ABS: 1.1 10*3/uL (ref 0.7–4.0)
LYMPHS PCT: 26 % (ref 12–46)
MCH: 32.6 pg (ref 26.0–34.0)
MCHC: 34.1 g/dL (ref 30.0–36.0)
MCV: 95.4 fL (ref 78.0–100.0)
MONO ABS: 0.4 10*3/uL (ref 0.1–1.0)
Monocytes Relative: 10 % (ref 3–12)
NEUTROS ABS: 2.5 10*3/uL (ref 1.7–7.7)
Neutrophils Relative %: 62 % (ref 43–77)
Platelets: 186 10*3/uL (ref 150–400)
RBC: 3.93 MIL/uL — AB (ref 4.22–5.81)
RDW: 12.9 % (ref 11.5–15.5)
WBC: 4 10*3/uL (ref 4.0–10.5)

## 2014-12-22 LAB — I-STAT TROPONIN, ED: Troponin i, poc: 0 ng/mL (ref 0.00–0.08)

## 2014-12-22 LAB — URINALYSIS, ROUTINE W REFLEX MICROSCOPIC
BILIRUBIN URINE: NEGATIVE
GLUCOSE, UA: NEGATIVE mg/dL
Hgb urine dipstick: NEGATIVE
KETONES UR: NEGATIVE mg/dL
NITRITE: NEGATIVE
Protein, ur: NEGATIVE mg/dL
Specific Gravity, Urine: 1.014 (ref 1.005–1.030)
Urobilinogen, UA: 1 mg/dL (ref 0.0–1.0)
pH: 6.5 (ref 5.0–8.0)

## 2014-12-22 LAB — URINE MICROSCOPIC-ADD ON

## 2014-12-22 LAB — I-STAT CG4 LACTIC ACID, ED: LACTIC ACID, VENOUS: 1.15 mmol/L (ref 0.5–2.0)

## 2014-12-22 MED ORDER — LIDOCAINE-EPINEPHRINE (PF) 2 %-1:200000 IJ SOLN
10.0000 mL | Freq: Once | INTRAMUSCULAR | Status: AC
  Administered 2014-12-22: 10 mL
  Filled 2014-12-22: qty 20

## 2014-12-22 MED ORDER — TETANUS-DIPHTH-ACELL PERTUSSIS 5-2.5-18.5 LF-MCG/0.5 IM SUSP
0.5000 mL | Freq: Once | INTRAMUSCULAR | Status: AC
  Administered 2014-12-22: 0.5 mL via INTRAMUSCULAR
  Filled 2014-12-22: qty 0.5

## 2014-12-22 MED ORDER — SULFAMETHOXAZOLE-TRIMETHOPRIM 800-160 MG PO TABS: 1.0000 | ORAL_TABLET | Freq: Two times a day (BID) | ORAL | Status: DC

## 2014-12-22 NOTE — ED Notes (Signed)
In room with PA who inserted 3 stitches into laceration above pt left eye brow. No complications during procedure. Called the nursing home to see about pt transport, they stated they had no means of transporting the resident and that we should call EMS. Will call EMS when discharge papers are available.

## 2014-12-22 NOTE — ED Notes (Signed)
[facility] presents with a 81 yo male [facility] with a unwitnessed fall from a wheelchair.  Patient has a 1 to 1 1/2 inch laceration just above left eyebrow.  Hx HTN.  No LOC, no toleration of movement.  Very stiff musculature. Hx of dementia.  Normal orientation for pt at this time.  No crepitus.  No other complains of pain.  Afib on monitor and hypertensive at this time.

## 2014-12-22 NOTE — ED Notes (Signed)
Bed: WHALD Expected date:  Expected time:  Means of arrival:  Comments: 

## 2014-12-22 NOTE — Discharge Instructions (Signed)
Keep wound and clean with mild soap and water. Keep area covered with a topical antibiotic ointment and bandage, keep bandage dry, and do not submerge in water for 24 hours. Ice and elevate for additional pain relief and swelling. Alternate between Ibuprofen and Tylenol for additional pain relief. Follow up with your primary care doctor or the [facility] in approximately 7-10 days for wound recheck and suture removal. Monitor area for signs of infection to include, but not limited to: increasing pain, redness, drainage/pus, or swelling. Return to emergency department for emergent changing or worsening symptoms.  Stay very well hydrated with plenty of water throughout the day. Take antibiotic until completed. Follow up with primary care physician in 1 week for recheck of ongoing symptoms but return to ER for emergent changing or worsening of symptoms. Please seek immediate care if you develop the following: You develop back pain.  Your symptoms are no better, or worse in 3 days. There is severe back pain or lower abdominal pain.  You develop chills.  You have a fever.  There is nausea or vomiting.  There is continued burning or discomfort with urination.     Facial Laceration  A facial laceration is a cut on the face. These injuries can be painful and cause bleeding. Lacerations usually heal quickly, but they need special care to reduce scarring. DIAGNOSIS  Your health care provider will take a medical history, ask for details about how the injury occurred, and examine the wound to determine how deep the cut is. TREATMENT  Some facial lacerations may not require closure. Others may not be able to be closed because of an increased risk of infection. The risk of infection and the chance for successful closure will depend on various factors, including the amount of time since the injury occurred. The wound may be cleaned to help prevent infection. If closure is appropriate, pain  medicines may be given if needed. Your health care provider will use stitches (sutures), wound glue (adhesive), or skin adhesive strips to repair the laceration. These tools bring the skin edges together to allow for faster healing and a better cosmetic outcome. If needed, you may also be given a tetanus shot. HOME CARE INSTRUCTIONS  Only take over-the-counter or prescription medicines as directed by your health care provider.  Follow your health care provider's instructions for wound care. These instructions will vary depending on the technique used for closing the wound. For Sutures:  Keep the wound clean and dry.   If you were given a bandage (dressing), you should change it at least once a day. Also change the dressing if it becomes wet or dirty, or as directed by your health care provider.   Wash the wound with soap and water 2 times a day. Rinse the wound off with water to remove all soap. Pat the wound dry with a clean towel.   After cleaning, apply a thin layer of the antibiotic ointment recommended by your health care provider. This will help prevent infection and keep the dressing from sticking.   You may shower as usual after the first 24 hours. Do not soak the wound in water until the sutures are removed.   Get your sutures removed as directed by your health care provider. With facial lacerations, sutures should usually be taken out after 4-5 days to avoid stitch marks.   Wait a few days after your sutures are removed before applying any makeup. For Skin Adhesive Strips:  Keep the  wound clean and dry.   Do not get the skin adhesive strips wet. You may bathe carefully, using caution to keep the wound dry.   If the wound gets wet, pat it dry with a clean towel.   Skin adhesive strips will fall off on their own. You may trim the strips as the wound heals. Do not remove skin adhesive strips that are still stuck to the wound. They will fall off in time.  For Wound  Adhesive:  You may briefly wet your wound in the shower or bath. Do not soak or scrub the wound. Do not swim. Avoid periods of heavy sweating until the skin adhesive has fallen off on its own. After showering or bathing, gently pat the wound dry with a clean towel.   Do not apply liquid medicine, cream medicine, ointment medicine, or makeup to your wound while the skin adhesive is in place. This may loosen the film before your wound is healed.   If a dressing is placed over the wound, be careful not to apply tape directly over the skin adhesive. This may cause the adhesive to be pulled off before the wound is healed.   Avoid prolonged exposure to sunlight or tanning lamps while the skin adhesive is in place.  The skin adhesive will usually remain in place for 5-10 days, then naturally fall off the skin. Do not pick at the adhesive film.  After Healing: Once the wound has healed, cover the wound with sunscreen during the day for 1 full year. This can help minimize scarring. Exposure to ultraviolet light in the first year will darken the scar. It can take 1-2 years for the scar to lose its redness and to heal completely.  SEEK IMMEDIATE MEDICAL CARE IF:  You have redness, pain, or swelling around the wound.   You see ayellowish-white fluid (pus) coming from the wound.   You have chills or a fever.  MAKE SURE YOU:  Understand these instructions.  Will watch your condition.  Will get help right away if you are not doing well or get worse. Document Released: 12/17/2004 Document Revised: 08/30/2013 Document Reviewed: 06/22/2013 [facility] Patient Information 2015 [place], [place]. This information is not intended to replace advice given to you by your health care provider. Make sure you discuss any questions you have with your health care provider.  Hypertension Hypertension is another name for high blood pressure. High blood pressure forces your heart to work harder to pump blood. A  blood pressure reading has two numbers, which includes a higher number over a lower number (example: 110/72). HOME CARE   Have your blood pressure rechecked by your doctor.  Only take medicine as told by your doctor. Follow the directions carefully. The medicine does not work as well if you skip doses. Skipping doses also puts you at risk for problems.  Do not smoke.  Monitor your blood pressure at home as told by your doctor. GET HELP IF:  You think you are having a reaction to the medicine you are taking.  You have repeat headaches or feel dizzy.  You have puffiness (swelling) in your ankles.  You have trouble with your vision. GET HELP RIGHT AWAY IF:   You get a very bad headache and are confused.  You feel weak, numb, or faint.  You get chest or belly (abdominal) pain.  You throw up (vomit).  You cannot breathe very well. MAKE SURE YOU:   Understand these instructions.  Will watch your condition.  Will get help right away if you are not doing well or get worse. Document Released: 04/27/2008 Document Revised: 11/14/2013 Document Reviewed: 09/01/2013 [facility] Patient Information 2015 [place], [place]. This information is not intended to replace advice given to you by your health care provider. Make sure you discuss any questions you have with your health care provider.  Laceration Care, Adult A laceration is a cut that goes through all layers of the skin. The cut goes into the tissue beneath the skin. HOME CARE For stitches (sutures) or staples:  Keep the cut clean and dry.  If you have a bandage (dressing), change it at least once a day. Change the bandage if it gets wet or dirty, or as told by your doctor.  Wash the cut with soap and water 2 times a day. Rinse the cut with water. Pat it dry with a clean towel.  Put a thin layer of medicated cream on the cut as told by your doctor.  You may shower after the first 24 hours. Do not soak the cut in water until the  stitches are removed.  Only take medicines as told by your doctor.  Have your stitches or staples removed as told by your doctor. For skin adhesive strips:  Keep the cut clean and dry.  Do not get the strips wet. You may take a bath, but be careful to keep the cut dry.  If the cut gets wet, pat it dry with a clean towel.  The strips will fall off on their own. Do not remove the strips that are still stuck to the cut. For wound glue:  You may shower or take baths. Do not soak or scrub the cut. Do not swim. Avoid heavy sweating until the glue falls off on its own. After a shower or bath, pat the cut dry with a clean towel.  Do not put medicine on your cut until the glue falls off.  If you have a bandage, do not put tape over the glue.  Avoid lots of sunlight or tanning lamps until the glue falls off. Put sunscreen on the cut for the first year to reduce your scar.  The glue will fall off on its own. Do not pick at the glue. You may need a tetanus shot if:  You cannot remember when you had your last tetanus shot.  You have never had a tetanus shot. If you need a tetanus shot and you choose not to have one, you may get tetanus. Sickness from tetanus can be serious. GET HELP RIGHT AWAY IF:   Your pain does not get better with medicine.  Your arm, hand, leg, or foot loses feeling (numbness) or changes color.  Your cut is bleeding.  Your joint feels weak, or you cannot use your joint.  You have painful lumps on your body.  Your cut is red, puffy (swollen), or painful.  You have a red line on the skin near the cut.  You have yellowish-white fluid (pus) coming from the cut.  You have a fever.  You have a bad smell coming from the cut or bandage.  Your cut breaks open before or after stitches are removed.  You notice something coming out of the cut, such as wood or glass.  You cannot move a finger or toe. MAKE SURE YOU:   Understand these instructions.  Will watch  your condition.  Will get help right away if you are not doing well or get worse. Document Released: 04/27/2008  Document Revised: 02/01/2012 Document Reviewed: 05/05/2011 [facility] Patient Information 2015 [place], [place]. This information is not intended to replace advice given to you by your health care provider. Make sure you discuss any questions you have with your health care provider.  Stitches, Staples, or Skin Adhesive Strips  Stitches (sutures), staples, and skin adhesive strips hold the skin together as it heals. They will usually be in place for 7 days or less. HOME CARE  Wash your hands with soap and water before and after you touch your wound.  Only take medicine as told by your doctor.  Cover your wound only if your doctor told you to. Otherwise, leave it open to air.  Do not get your stitches wet or dirty. If they get dirty, dab them gently with a clean washcloth. Wet the washcloth with soapy water. Do not rub. Pat them dry gently.  Do not put medicine or medicated cream on your stitches unless your doctor told you to.  Do not take out your own stitches or staples. Skin adhesive strips will fall off by themselves.  Do not pick at the wound. Picking can cause an infection.  Do not miss your follow-up appointment.  If you have problems or questions, call your doctor. GET HELP RIGHT AWAY IF:   You have a temperature by mouth above 102 F (38.9 C), not controlled by medicine.  You have chills.  You have redness or pain around your stitches.  There is puffiness (swelling) around your stitches.  You notice fluid (drainage) from your stitches.  There is a bad smell coming from your wound. MAKE SURE YOU:  Understand these instructions.  Will watch your condition.  Will get help if you are not doing well or get worse. Document Released: 09/06/2009 Document Revised: 02/01/2012 Document Reviewed: 09/06/2009 [facility] Patient Information 2015 [place], [place]. This  information is not intended to replace advice given to you by your health care provider. Make sure you discuss any questions you have with your health care provider.  Urinary Tract Infection A urinary tract infection (UTI) can occur any place along the urinary tract. The tract includes the kidneys, ureters, bladder, and urethra. A type of germ called bacteria often causes a UTI. UTIs are often helped with antibiotic medicine.  HOME CARE   If given, take antibiotics as told by your doctor. Finish them even if you start to feel better.  Drink enough fluids to keep your pee (urine) clear or pale yellow.  Avoid tea, drinks with caffeine, and bubbly (carbonated) drinks.  Pee often. Avoid holding your pee in for a long time.  Pee before and after having sex (intercourse).  Wipe from front to back after you poop (bowel movement) if you are a woman. Use each tissue only once. GET HELP RIGHT AWAY IF:   You have back pain.  You have lower belly (abdominal) pain.  You have chills.  You feel sick to your stomach (nauseous).  You throw up (vomit).  Your burning or discomfort with peeing does not go away.  You have a fever.  Your symptoms are not better in 3 days. MAKE SURE YOU:   Understand these instructions.  Will watch your condition.  Will get help right away if you are not doing well or get worse. Document Released: 04/27/2008 Document Revised: 08/03/2012 Document Reviewed: 06/09/2012 [facility] Patient Information 2015 [place], [place]. This information is not intended to replace advice given to you by your health care provider. Make sure you discuss any questions  you have with your health care provider.

## 2014-12-22 NOTE — ED Provider Notes (Signed)
CSN: [id number]     Arrival date & time 12/22/14  1645 History   First MD Initiated Contact with Patient 12/22/14 1728     Chief Complaint  Patient presents with  . Fall     (Consider location/radiation/quality/duration/timing/severity/associated sxs/prior Treatment) HPI Comments: Richard Booth is a 81 y.o. male with a PMHx of dementia, HTN, and anemia, who presents to the ED via EMS from [facility] nursing home with complaints of fall from his wheelchair was unwitnessed. Patient has baseline dementia, and according to his daughter he is nonverbal at baseline and does not typically follow commands. She reports that he is behaving normally today. She is unsure of any of his medications, or when he last took them. Patient is unable to report  Any symptoms or give any history due to his dementia.  Patient is a 81 y.o. male presenting with fall. The history is provided by the EMS personnel and a relative. The history is limited by the condition of the patient. No language interpreter was used.  Fall This is a new problem. The current episode started today. The problem occurs intermittently. The problem has been unchanged. Nothing aggravates the symptoms. He has tried nothing for the symptoms. The treatment provided no relief.    Past Medical History  Diagnosis Date  . Anemia   . Hypertension   . Dementia   . Altered mental status   . Osteoarthritis   . Alzheimer's dementia   . Vitamin B deficiency   . Allergic rhinitis   . Osteoarthritis   . Tobacco use disorder   . Edema   . BPH with obstruction/lower urinary tract symptoms   . Urinary incontinence   . Elevated prostate specific antigen (PSA)   . Hypothyroidism    History reviewed. No pertinent past surgical history. History reviewed. No pertinent family history. History  Substance Use Topics  . Smoking status: Smoker, Current Status Unknown  . Smokeless tobacco: Not on file  . Alcohol Use: No    Review of Systems    Unable to perform ROS: Dementia   10 Systems reviewed and are negative for acute change except as noted in the HPI.    Allergies  Review of patient's allergies indicates no known allergies.  Home Medications   Prior to Admission medications   Medication Sig Start Date End Date Taking? Authorizing Provider  acetaminophen (TYLENOL) 500 MG tablet Take 500 mg by mouth 3 (three) times daily as needed for moderate pain, fever or headache. Takes scheduled 3 times per day, but may also take every 4 hours if needed   Yes Historical Provider, MD  chlorhexidine (PERIDEX) 0.12 % solution Use as directed 15 mLs in the mouth or throat 2 (two) times daily.   Yes Historical Provider, MD  divalproex (DEPAKOTE) 125 MG DR tablet Take 125 mg by mouth 2 (two) times daily.   Yes Historical Provider, MD  ferrous sulfate 325 (65 FE) MG tablet Take 325 mg by mouth daily with breakfast.   Yes Historical Provider, MD  finasteride (PROSCAR) 5 MG tablet Take 5 mg by mouth daily with breakfast.    Yes Historical Provider, MD  fluticasone (FLONASE) 50 MCG/ACT nasal spray Place 1 spray into the nose daily before breakfast.    Yes Historical Provider, MD  hydrochlorothiazide (MICROZIDE) 12.5 MG capsule Take 12.5 mg by mouth daily. Hold if systolic pressure is less than 110 or dystolic blood pressure is is less than 60   Yes Historical Provider, MD  levothyroxine (  SYNTHROID, LEVOTHROID) 88 MCG tablet Take 88 mcg by mouth daily before breakfast.   Yes Historical Provider, MD  lisinopril (PRINIVIL,ZESTRIL) 10 MG tablet Take 10 mg by mouth daily with breakfast.    Yes Historical Provider, MD  memantine (NAMENDA) 5 MG tablet Take 5 mg by mouth 2 (two) times daily.   Yes Historical Provider, MD  Nutritional Supplements (NUTRITIONAL DRINK PLUS) LIQD Take 1 Can by mouth 3 (three) times daily. *Great Shakes*   Yes Historical Provider, MD  terazosin (HYTRIN) 5 MG capsule Take 5 mg by mouth at bedtime.     Yes Historical Provider, MD   cephALEXin (KEFLEX) 500 MG capsule Take 1 capsule (500 mg total) by mouth 4 (four) times daily. Patient not taking: Reported on 12/22/2014 11/29/14   Rolland PorterMark James, MD  cetirizine (ZYRTEC) 10 MG tablet Take 10 mg by mouth daily as needed for allergies.    Historical Provider, MD  ciprofloxacin (CIPRO) 500 MG tablet Take 1 tablet (500 mg total) by mouth 2 (two) times daily. One po bid x 7 days Patient not taking: Reported on 11/29/2014 10/05/14   Rolan BuccoMelanie Belfi, MD  guaifenesin (ROBITUSSIN) 100 MG/5ML syrup Take 200 mg by mouth every 6 (six) hours as needed for cough.    Historical Provider, MD  loperamide (IMODIUM) 2 MG capsule Take 2 mg by mouth as needed for diarrhea or loose stools.    Historical Provider, MD  magnesium hydroxide (MILK OF MAGNESIA) 400 MG/5ML suspension Take 30 mLs by mouth daily as needed for mild constipation.    Historical Provider, MD  neomycin-bacitracin-polymyxin (NEOSPORIN) [phone] ointment Apply 1 application topically daily as needed (skin abrasions or minor tears).    Historical Provider, MD  sodium chloride (OCEAN) 0.65 % SOLN nasal spray Place 2 sprays into both nostrils every 4 (four) hours as needed for congestion.    Historical Provider, MD   BP 186/106 mmHg  Pulse 99  Temp(Src) 98.8 F (37.1 C) (Oral)  Resp 18  SpO2 99% Physical Exam  Constitutional: He appears well-developed.  Non-toxic appearance. No distress.  Thin frail elderly male, nonverbal at baseline. Alert.  HENT:  Head: Normocephalic. Head is with laceration. Head is without raccoon's eyes and without Battle's sign.    Mouth/Throat: Oropharynx is clear and moist and mucous membranes are normal.  Approx 2-3cm lac above L eyebrow, bleeding but controlled with pressure. No crepitus or skull tenderness although pt is nonverbal at baseline. No raccoon eyes or battle's sign  Eyes: Conjunctivae are normal. Right eye exhibits no discharge. Left eye exhibits no discharge.  Pinpoint pupils, uncooperative  with EOM exam  Neck: Normal range of motion. Neck supple. No spinous process tenderness and no muscular tenderness present. Normal range of motion present.  FROM intact without spinous process or paraspinous muscle TTP, no bony stepoffs or deformities, no muscle spasms. No rigidity or meningeal signs. No bruising or swelling.   Cardiovascular: Normal rate, regular rhythm, normal heart sounds and intact distal pulses.  Exam reveals no gallop and no friction rub.   No murmur heard. RRR, nl s1/s2, no m/r/g, distal pulses intact, no pedal edema   Pulmonary/Chest: Effort normal and breath sounds normal. No respiratory distress. He has no decreased breath sounds. He has no wheezes. He has no rhonchi. He has no rales.  Abdominal: Soft. Normal appearance and bowel sounds are normal. He exhibits no distension. There is no tenderness. There is no rigidity, no rebound, no guarding, no tenderness at McBurney's point and negative Murphy's  sign.  Musculoskeletal: Normal range of motion.  B/L hips with no deformities or crepitus, no bony TTP but pt is nonverbal. No limb length discrepancy. Does not follow commands for strength and sensation testing  Neurological: He is alert.  Alert but nonverbal unable to perform full neuro exam due to baseline dementia  Skin: Skin is warm and dry. Laceration noted. No rash noted.  L eyebrow lac as noted above  Psychiatric: Cognition and memory are impaired. He is noncommunicative.  Baseline dementia, nonverbal  Nursing note and vitals reviewed.   ED Course  LACERATION REPAIR Date/Time: 12/22/2014 8:10 PM Performed by: CAMPRUBI-SOMS, Lucindia Lemley STRUPP Authorized by: Ramond Marrow Consent: Verbal consent obtained. Risks and benefits: risks, benefits and alternatives were discussed Consent given by: guardian Patient identity confirmed: arm band Body area: head/neck Location details: left eyebrow Laceration length: 3 cm Foreign bodies: no foreign  bodies Tendon involvement: none Nerve involvement: none Vascular damage: no Anesthesia: local infiltration Local anesthetic: lidocaine 2% with epinephrine Anesthetic total: 2 ml Patient sedated: no Preparation: Patient was prepped and draped in the usual sterile fashion. Irrigation solution: saline Irrigation method: syringe Amount of cleaning: standard Debridement: none Degree of undermining: none Skin closure: 5-0 Prolene Number of sutures: 3 Technique: simple Approximation: close Approximation difficulty: simple Dressing: 4x4 sterile gauze Patient tolerance: Patient tolerated the procedure well with no immediate complications   (including critical care time) Labs Review Labs Reviewed  CBC WITH DIFFERENTIAL/PLATELET - Abnormal; Notable for the following:    RBC 3.93 (*)    Hemoglobin 12.8 (*)    HCT 37.5 (*)    All other components within normal limits  BASIC METABOLIC PANEL - Abnormal; Notable for the following:    Sodium 131 (*)    Potassium 3.4 (*)    Chloride 94 (*)    GFR calc non Af Amer 81 (*)    All other components within normal limits  URINALYSIS, ROUTINE W REFLEX MICROSCOPIC - Abnormal; Notable for the following:    Leukocytes, UA MODERATE (*)    All other components within normal limits  URINE MICROSCOPIC-ADD ON - Abnormal; Notable for the following:    Bacteria, UA FEW (*)    All other components within normal limits  URINE CULTURE  I-STAT TROPOININ, ED  I-STAT CG4 LACTIC ACID, ED    Imaging Review Dg Chest 2 View  12/22/2014   CLINICAL DATA:  RN notes: Richard Booth presents with a 81 yo male from [facility] with a unwitnessed fall from a wheelchair. Patient has a 1 to 1 1/2 inch laceration just above left eyebrow. Hx HTN. No LOC, no toleration of movement. Very stiff musculature. Hx of dementia. Normal orientation for pt at this time. No crepitus. No other complains of pain. Afib on monitor and hypertensive at this time.  EXAM: CHEST  2  VIEW  COMPARISON:  11/29/2014  FINDINGS: Mild enlargement of the cardiopericardial silhouette. Aorta is uncoiled. No mediastinal or hilar masses.  Clear lungs.  No pleural effusion or pneumothorax.  Bony thorax is grossly intact.  IMPRESSION: No acute cardiopulmonary disease.   Electronically Signed   By: Amie Portland M.D.   On: 12/22/2014 18:58   Ct Head Wo Contrast  12/22/2014   CLINICAL DATA:  Unwitnessed fall from wheelchair  EXAM: CT HEAD WITHOUT CONTRAST  CT CERVICAL SPINE WITHOUT CONTRAST  TECHNIQUE: Multidetector CT imaging of the head and cervical spine was performed following the standard protocol without intravenous contrast. Multiplanar CT image reconstructions of  the cervical spine were also generated.  COMPARISON:  Head CT November 29, 2014; cervical spine CT September 01, 2014  FINDINGS: CT HEAD FINDINGS  Moderately severe diffuse atrophy is stable. There is no intracranial mass, hemorrhage, extra-axial fluid collection, or midline shift. There is patchy small vessel disease in the centra semiovale bilaterally. There is no new gray-white compartment lesion. No acute infarct apparent. The bony calvarium appears intact. Mastoid air cells are clear on the right. Mastoids on the left are clear except for some opacification of several inferior mastoids which are stable.  CT CERVICAL SPINE FINDINGS  There is no demonstrable fracture or spondylolisthesis. Prevertebral soft tissues and predental space regions are normal. There is marked disc space narrowing at C5-6 and C6-7. There is moderate narrowing at C3-4 and C7-T1. There is mild narrowing at C2-3 and C4-5. There are prominent anterior osteophytes at all levels except for C2. There is extensive facet hypertrophy at multiple levels with exit foraminal narrowing at most levels bilaterally. No frank disc extrusion or high-grade stenosis. There is mild mid cervical levoscoliosis. There are foci of calcification in each carotid artery.  IMPRESSION: CT head:  Extensive atrophy with patchy small vessel disease. No intracranial mass, hemorrhage, or extra-axial fluid collection. No acute appearing infarct. Inferior mastoid disease on the left, stable.  CT cervical spine: Extensive spondylosis and osteoarthritic change. No fracture or spondylolisthesis. Patchy calcification in each carotid artery.   Electronically Signed   By: Bretta Bang M.D.   On: 12/22/2014 18:48   Ct Cervical Spine Wo Contrast  12/22/2014   CLINICAL DATA:  Unwitnessed fall from wheelchair  EXAM: CT HEAD WITHOUT CONTRAST  CT CERVICAL SPINE WITHOUT CONTRAST  TECHNIQUE: Multidetector CT imaging of the head and cervical spine was performed following the standard protocol without intravenous contrast. Multiplanar CT image reconstructions of the cervical spine were also generated.  COMPARISON:  Head CT November 29, 2014; cervical spine CT September 01, 2014  FINDINGS: CT HEAD FINDINGS  Moderately severe diffuse atrophy is stable. There is no intracranial mass, hemorrhage, extra-axial fluid collection, or midline shift. There is patchy small vessel disease in the centra semiovale bilaterally. There is no new gray-white compartment lesion. No acute infarct apparent. The bony calvarium appears intact. Mastoid air cells are clear on the right. Mastoids on the left are clear except for some opacification of several inferior mastoids which are stable.  CT CERVICAL SPINE FINDINGS  There is no demonstrable fracture or spondylolisthesis. Prevertebral soft tissues and predental space regions are normal. There is marked disc space narrowing at C5-6 and C6-7. There is moderate narrowing at C3-4 and C7-T1. There is mild narrowing at C2-3 and C4-5. There are prominent anterior osteophytes at all levels except for C2. There is extensive facet hypertrophy at multiple levels with exit foraminal narrowing at most levels bilaterally. No frank disc extrusion or high-grade stenosis. There is mild mid cervical levoscoliosis.  There are foci of calcification in each carotid artery.  IMPRESSION: CT head: Extensive atrophy with patchy small vessel disease. No intracranial mass, hemorrhage, or extra-axial fluid collection. No acute appearing infarct. Inferior mastoid disease on the left, stable.  CT cervical spine: Extensive spondylosis and osteoarthritic change. No fracture or spondylolisthesis. Patchy calcification in each carotid artery.   Electronically Signed   By: Bretta Bang M.D.   On: 12/22/2014 18:48     EKG Interpretation None    EKG: sinus rhythm with LVH, no acute changes  MDM   Final diagnoses:  Fall  Facial laceration, initial encounter  Essential hypertension  Hyponatremia  Hypokalemia  Hypochloremia  UTI (lower urinary tract infection)    81 y.o. male here s/p unwitnessed fall, baseline dementia, per daughter he is not acting any differently. Has head lac. Last tetanus unknown, in computer it states it was due in 1956, will update today. Will obtain labs, CXR, CT head/neck, u/a, and repair wound. Hips without crepitus or deformity but pt unable to report any pain. Will reassess soon. HTN noted, but this is consistent with prior ED visits. Pt stable at this time.  8:20 PM U/A showing UTI, will treat. Lactic acid neg. Trop neg. CBC with no acute changes. BMP showing Na 131 and K 3.4 with Cl 94, all similar to prior ED visits. CXR WNL. EKG unremarkable. CT head/neck without acute changes. Sutures placed with good hemostasis. At this time, pt cleared for discharge, awaiting ride. Instructions given to bring to nursing facility, with instruction on suture care and removal in 7-10 days, as well as UTI instructions. Stable at this time    BP 165/110 mmHg  Pulse 67  Temp(Src) 98.8 F (37.1 C) (Oral)  Resp 18  SpO2 97%  Meds ordered this encounter  Medications  . Tdap (BOOSTRIX) injection 0.5 mL    Sig:   . lidocaine-EPINEPHrine (XYLOCAINE W/EPI) 2 %-1:200000 (PF) injection 10 mL    Sig:     . sulfamethoxazole-trimethoprim (BACTRIM DS,SEPTRA DS) 800-160 MG per tablet    Sig: Take 1 tablet by mouth 2 (two) times daily.    Dispense:  14 tablet    Refill:  0    Order Specific Question:  Supervising Provider    Answer:  Vida Roller [address] Camprubi-Soms, PA-C 12/22/14 2049  Toy Baker, MD 12/22/14 [phone]

## 2014-12-22 NOTE — ED Notes (Signed)
Orie RoutSharon Hatfield [phone]  - Daughter  [facility]

## 2014-12-22 NOTE — ED Notes (Signed)
[facility] here at this time to transport pt back to SNF

## 2014-12-22 NOTE — ED Notes (Signed)
[facility], also called [facility] to alert for need to transport. Pt sleeping, will continue to monitor

## 2014-12-27 LAB — URINE CULTURE: SPECIAL REQUESTS: NORMAL

## 2015-01-07 ENCOUNTER — Encounter: Payer: Self-pay | Admitting: Internal Medicine

## 2015-01-07 ENCOUNTER — Non-Acute Institutional Stay (SKILLED_NURSING_FACILITY): Payer: Medicare Other | Admitting: Internal Medicine

## 2015-01-07 DIAGNOSIS — R972 Elevated prostate specific antigen [PSA]: Secondary | ICD-10-CM

## 2015-01-07 DIAGNOSIS — E034 Atrophy of thyroid (acquired): Secondary | ICD-10-CM | POA: Diagnosis not present

## 2015-01-07 DIAGNOSIS — R32 Unspecified urinary incontinence: Secondary | ICD-10-CM | POA: Diagnosis not present

## 2015-01-07 DIAGNOSIS — G309 Alzheimer's disease, unspecified: Secondary | ICD-10-CM

## 2015-01-07 DIAGNOSIS — M159 Polyosteoarthritis, unspecified: Secondary | ICD-10-CM

## 2015-01-07 DIAGNOSIS — E538 Deficiency of other specified B group vitamins: Secondary | ICD-10-CM | POA: Diagnosis not present

## 2015-01-07 DIAGNOSIS — D6489 Other specified anemias: Secondary | ICD-10-CM

## 2015-01-07 DIAGNOSIS — N400 Enlarged prostate without lower urinary tract symptoms: Secondary | ICD-10-CM | POA: Diagnosis not present

## 2015-01-07 DIAGNOSIS — I10 Essential (primary) hypertension: Secondary | ICD-10-CM | POA: Diagnosis not present

## 2015-01-07 DIAGNOSIS — M150 Primary generalized (osteo)arthritis: Secondary | ICD-10-CM | POA: Diagnosis not present

## 2015-01-07 DIAGNOSIS — W19XXXA Unspecified fall, initial encounter: Secondary | ICD-10-CM | POA: Diagnosis not present

## 2015-01-07 DIAGNOSIS — F0280 Dementia in other diseases classified elsewhere without behavioral disturbance: Secondary | ICD-10-CM

## 2015-01-07 DIAGNOSIS — E038 Other specified hypothyroidism: Secondary | ICD-10-CM

## 2015-01-28 DIAGNOSIS — R972 Elevated prostate specific antigen [PSA]: Secondary | ICD-10-CM

## 2015-01-28 DIAGNOSIS — R32 Unspecified urinary incontinence: Secondary | ICD-10-CM | POA: Insufficient documentation

## 2015-01-28 DIAGNOSIS — N400 Enlarged prostate without lower urinary tract symptoms: Secondary | ICD-10-CM | POA: Insufficient documentation

## 2015-01-28 DIAGNOSIS — D6489 Other specified anemias: Secondary | ICD-10-CM | POA: Insufficient documentation

## 2015-01-28 DIAGNOSIS — W19XXXA Unspecified fall, initial encounter: Secondary | ICD-10-CM | POA: Insufficient documentation

## 2015-01-28 DIAGNOSIS — E538 Deficiency of other specified B group vitamins: Secondary | ICD-10-CM | POA: Insufficient documentation

## 2015-01-28 NOTE — Progress Notes (Signed)
Patient ID: Richard Booth, male   DOB: February 15, 1934, 81 y.o.   MRN: [id number]    HISTORY AND PHYSICAL  Location:  [facility]    Place of Service: SNF [phone])   Extended Emergency Contact Information Primary Emergency Contact: Belvedere,Iris Address: [address]          [place] [address] [place] Home Phone: [phone] Relation: Spouse Secondary Emergency Contact: Hatfield,Sharon/James  [place] Home Phone: [phone] Relation: Daughter  Advanced Directive information  FULL; MOST form on chart  Chief Complaint  Patient presents with  . New Admit To SNF    alzheimer's disease, fall, hyperlipidemia, anemia, OA, HTN, CKD, B12 deficiency, allergic rhinitis, Vit D deficiency, edema, urinary incontinence, hypothyroidism, elevated PSA    HPI:  81 yo male seen today as a new admission into SNF. He has no c/o and there have been no nursing issues. He is here for long term placement.  He has Alzheimer's and frequent falls. He sustained a cut on forehead this last tine that req'd sutures. He takes Depakote and Namenda.  HTN is stable on HCTZ and lisinopril.  Thyroid controlled with levothyroxine.  He takes terazosin and finesteride for enlarged prostate. He has a hx elevated PSA.  Sesaonal allergy controlled on flonase and cetirizine. He also uses saline nasal spray prn.  Iron supplement helps anemia.  Past Medical History  Diagnosis Date  . Anemia   . Hypertension   . Dementia   . Altered mental status   . Osteoarthritis   . Alzheimer's dementia   . Vitamin B deficiency   . Allergic rhinitis   . Osteoarthritis   . Tobacco use disorder   . Edema   . BPH with obstruction/lower urinary tract symptoms   . Urinary incontinence   . Elevated prostate specific antigen (PSA)   . Hypothyroidism     No past surgical history on file.  Patient Care Team: Florentina Jenny, MD as PCP - General (Family Medicine) Ezzie Dural,  MD (Urology)  History   Social History  . Marital Status: Married    Spouse Name: N/A  . Number of Children: N/A  . Years of Education: N/A   Occupational History  . Not on file.   Social History Main Topics  . Smoking status: Smoker, Current Status Unknown  . Smokeless tobacco: Not on file  . Alcohol Use: No  . Drug Use: Not on file  . Sexual Activity: Not on file   Other Topics Concern  . Not on file   Social History Narrative     reports that he has been smoking.  He does not have any smokeless tobacco history on file. He reports that he does not drink alcohol. His drug history is not on file.  No family history on file. No family status information on file.    Immunization History  Administered Date(s) Administered  . Tdap 12/22/2014    No Known Allergies  Medications: Patient's Medications  New Prescriptions   No medications on file  Previous Medications   ACETAMINOPHEN (TYLENOL) 500 MG TABLET    Take 500 mg by mouth 3 (three) times daily as needed for moderate pain, fever or headache. Takes scheduled 3 times per day, but may also take every 4 hours if needed   ALUM & MAG HYDROXIDE-SIMETH (MAALOX PLUS) 400-400-40 MG/5ML SUSPENSION    Take 30 mLs by mouth 4 (four) times daily as needed for indigestion (and heartburn).  CEPHALEXIN (KEFLEX) 500 MG CAPSULE    Take 1 capsule (500 mg total) by mouth 4 (four) times daily.   CETIRIZINE (ZYRTEC) 10 MG TABLET    Take 10 mg by mouth daily as needed for allergies.   CHLORHEXIDINE (PERIDEX) 0.12 % SOLUTION    Use as directed 15 mLs in the mouth or throat 2 (two) times daily.   CIPROFLOXACIN (CIPRO) 500 MG TABLET    Take 1 tablet (500 mg total) by mouth 2 (two) times daily. One po bid x 7 days   DIVALPROEX (DEPAKOTE) 125 MG DR TABLET    Take 125 mg by mouth 2 (two) times daily.   FERROUS SULFATE 325 (65 FE) MG TABLET    Take 325 mg by mouth daily with breakfast.   FINASTERIDE (PROSCAR) 5 MG TABLET    Take 5 mg by mouth  daily with breakfast.    FLUTICASONE (FLONASE) 50 MCG/ACT NASAL SPRAY    Place 1 spray into the nose daily before breakfast.    GUAIFENESIN (ROBITUSSIN) 100 MG/5ML SYRUP    Take 200 mg by mouth every 6 (six) hours as needed for cough.   HYDROCHLOROTHIAZIDE (MICROZIDE) 12.5 MG CAPSULE    Take 12.5 mg by mouth daily. Hold if systolic pressure is less than 209 or dystolic blood pressure is is less than 60   LEVOTHYROXINE (SYNTHROID, LEVOTHROID) 88 MCG TABLET    Take 88 mcg by mouth daily before breakfast.   LISINOPRIL (PRINIVIL,ZESTRIL) 10 MG TABLET    Take 10 mg by mouth daily with breakfast.    LOPERAMIDE (IMODIUM) 2 MG CAPSULE    Take 2 mg by mouth as needed for diarrhea or loose stools.   MAGNESIUM HYDROXIDE (MILK OF MAGNESIA) 400 MG/5ML SUSPENSION    Take 30 mLs by mouth at bedtime as needed (for constipation).    MEMANTINE (NAMENDA) 5 MG TABLET    Take 5 mg by mouth 2 (two) times daily.   MULTIPLE VITAMIN (THERA/BETA-CAROTENE) TABS    Take 1 tablet by mouth daily with breakfast.   NEOMYCIN-BACITRACIN-POLYMYXIN (NEOSPORIN) [phone] OINTMENT    Apply 1 application topically daily as needed (skin abrasions or minor tears).   NUTRITIONAL SUPPLEMENTS (NUTRITIONAL DRINK PLUS) LIQD    Take 1 Can by mouth 3 (three) times daily. *Great Shakes*   SODIUM CHLORIDE (OCEAN) 0.65 % SOLN NASAL SPRAY    Place 2 sprays into both nostrils every 4 (four) hours as needed for congestion.   SULFAMETHOXAZOLE-TRIMETHOPRIM (BACTRIM DS,SEPTRA DS) 800-160 MG PER TABLET    Take 1 tablet by mouth 2 (two) times daily.   TERAZOSIN (HYTRIN) 5 MG CAPSULE    Take 5 mg by mouth at bedtime.    Modified Medications   No medications on file  Discontinued Medications   No medications on file    Review of Systems  Unable to perform ROS: Dementia    Filed Vitals:   01/07/15 1634  BP: 135/88  Pulse: 81  Temp: 97 F (36.1 C)  SpO2: 94%   There is no weight on file to calculate BMI.  Physical Exam  Constitutional:    Frail appearing in NAD. Awake and alert  HENT:  Pt would not open his mouth  Eyes: Pupils are equal, round, and reactive to light. No scleral icterus.  Neck: Neck supple. No tracheal deviation present.  Cardiovascular: Normal rate, regular rhythm and intact distal pulses.  Exam reveals no gallop and no friction rub.   Murmur (1/6 SEM) heard. No carotid bruit b/l;  no distal LE swelling  Pulmonary/Chest: Effort normal and breath sounds normal. He has no wheezes. He has no rales. He exhibits no tenderness.  Abdominal: Soft. Bowel sounds are normal. He exhibits no distension, no abdominal bruit, no pulsatile midline mass and no mass. There is no tenderness. There is no rebound and no guarding.  Musculoskeletal: He exhibits edema and tenderness.  Lymphadenopathy:    He has no cervical adenopathy.  Neurological: He is alert.  Skin: Skin is warm and dry. No rash noted.  Left scalp sutures intact and no secondary signs of infection.  Psychiatric: He has a normal mood and affect. His behavior is normal.     Labs reviewed: Admission on 12/22/2014, Discharged on 12/22/2014  Component Date Value Ref Range Status  . WBC 12/22/2014 4.0  4.0 - 10.5 K/uL Final  . RBC 12/22/2014 3.93* 4.22 - 5.81 MIL/uL Final  . Hemoglobin 12/22/2014 12.8* 13.0 - 17.0 g/dL Final  . HCT 12/22/2014 37.5* 39.0 - 52.0 % Final  . MCV 12/22/2014 95.4  78.0 - 100.0 fL Final  . MCH 12/22/2014 32.6  26.0 - 34.0 pg Final  . MCHC 12/22/2014 34.1  30.0 - 36.0 g/dL Final  . RDW 12/22/2014 12.9  11.5 - 15.5 % Final  . Platelets 12/22/2014 186  150 - 400 K/uL Final  . Neutrophils Relative % 12/22/2014 62  43 - 77 % Final  . Neutro Abs 12/22/2014 2.5  1.7 - 7.7 K/uL Final  . Lymphocytes Relative 12/22/2014 26  12 - 46 % Final  . Lymphs Abs 12/22/2014 1.1  0.7 - 4.0 K/uL Final  . Monocytes Relative 12/22/2014 10  3 - 12 % Final  . Monocytes Absolute 12/22/2014 0.4  0.1 - 1.0 K/uL Final  . Eosinophils Relative 12/22/2014 2  0 -  5 % Final  . Eosinophils Absolute 12/22/2014 0.1  0.0 - 0.7 K/uL Final  . Basophils Relative 12/22/2014 0  0 - 1 % Final  . Basophils Absolute 12/22/2014 0.0  0.0 - 0.1 K/uL Final  . Sodium 12/22/2014 131* 135 - 145 mmol/L Final  . Potassium 12/22/2014 3.4* 3.5 - 5.1 mmol/L Final  . Chloride 12/22/2014 94* 96 - 112 mmol/L Final  . CO2 12/22/2014 30  19 - 32 mmol/L Final  . Glucose, Bld 12/22/2014 98  70 - 99 mg/dL Final  . BUN 12/22/2014 18  6 - 23 mg/dL Final  . Creatinine, Ser 12/22/2014 0.86  0.50 - 1.35 mg/dL Final  . Calcium 12/22/2014 8.8  8.4 - 10.5 mg/dL Final  . GFR calc non Af Amer 12/22/2014 81* >90 mL/min Final  . GFR calc Af Amer 12/22/2014 >90  >90 mL/min Final   Comment: (NOTE) The eGFR has been calculated using the CKD EPI equation. This calculation has not been validated in all clinical situations. eGFR's persistently <90 mL/min signify possible Chronic Kidney Disease.   . Anion gap 12/22/2014 7  5 - 15 Final  . Troponin i, poc 12/22/2014 0.00  0.00 - 0.08 ng/mL Final  . Comment 3 12/22/2014          Final   Comment: Due to the release kinetics of cTnI, a negative result within the first hours of the onset of symptoms does not rule out myocardial infarction with certainty. If myocardial infarction is still suspected, repeat the test at appropriate intervals.   . Color, Urine 12/22/2014 YELLOW  YELLOW Final  . APPearance 12/22/2014 CLEAR  CLEAR Final  . Specific Gravity, Urine 12/22/2014 1.014  1.005 - 1.030 Final  . pH 12/22/2014 6.5  5.0 - 8.0 Final  . Glucose, UA 12/22/2014 NEGATIVE  NEGATIVE mg/dL Final  . Hgb urine dipstick 12/22/2014 NEGATIVE  NEGATIVE Final  . Bilirubin Urine 12/22/2014 NEGATIVE  NEGATIVE Final  . Ketones, ur 12/22/2014 NEGATIVE  NEGATIVE mg/dL Final  . Protein, ur 12/22/2014 NEGATIVE  NEGATIVE mg/dL Final  . Urobilinogen, UA 12/22/2014 1.0  0.0 - 1.0 mg/dL Final  . Nitrite 12/22/2014 NEGATIVE  NEGATIVE Final  . Leukocytes, UA  12/22/2014 MODERATE* NEGATIVE Final  . Lactic Acid, Venous 12/22/2014 1.15  0.5 - 2.0 mmol/L Final  . Squamous Epithelial / LPF 12/22/2014 RARE  RARE Final  . WBC, UA 12/22/2014 11-20  <3 WBC/hpf Final  . RBC / HPF 12/22/2014 0-2  <3 RBC/hpf Final  . Bacteria, UA 12/22/2014 FEW* RARE Final  . Specimen Description 12/22/2014 URINE, CATHETERIZED   Final  . Special Requests 12/22/2014 Normal   Final  . Colony Count 12/22/2014    Final                   Value:>=100,000 COLONIES/ML Performed at [facility]   . Culture 12/22/2014    Final                   Value:VANCOMYCIN RESISTANT ENTEROCOCCUS ISOLATED Note: PATIENT DISCHARGED  WILL FAX TO PROVIDER Performed at [facility]   . Report Status 12/22/2014 12/27/2014 FINAL   Final  . Organism ID, Bacteria 12/22/2014 VANCOMYCIN RESISTANT ENTEROCOCCUS ISOLATED   Final  Admission on 11/29/2014, Discharged on 11/29/2014  Component Date Value Ref Range Status  . WBC 11/29/2014 5.3  4.0 - 10.5 K/uL Final  . RBC 11/29/2014 4.13* 4.22 - 5.81 MIL/uL Final  . Hemoglobin 11/29/2014 13.3  13.0 - 17.0 g/dL Final  . HCT 11/29/2014 39.7  39.0 - 52.0 % Final  . MCV 11/29/2014 96.1  78.0 - 100.0 fL Final  . MCH 11/29/2014 32.2  26.0 - 34.0 pg Final  . MCHC 11/29/2014 33.5  30.0 - 36.0 g/dL Final  . RDW 11/29/2014 12.8  11.5 - 15.5 % Final  . Platelets 11/29/2014 175  150 - 400 K/uL Final  . Neutrophils Relative % 11/29/2014 66  43 - 77 % Final  . Neutro Abs 11/29/2014 3.5  1.7 - 7.7 K/uL Final  . Lymphocytes Relative 11/29/2014 22  12 - 46 % Final  . Lymphs Abs 11/29/2014 1.2  0.7 - 4.0 K/uL Final  . Monocytes Relative 11/29/2014 11  3 - 12 % Final  . Monocytes Absolute 11/29/2014 0.6  0.1 - 1.0 K/uL Final  . Eosinophils Relative 11/29/2014 1  0 - 5 % Final  . Eosinophils Absolute 11/29/2014 0.0  0.0 - 0.7 K/uL Final  . Basophils Relative 11/29/2014 0  0 - 1 % Final  . Basophils Absolute 11/29/2014 0.0  0.0 - 0.1 K/uL Final  . Sodium  11/29/2014 134* 135 - 145 mmol/L Final   Please note change in reference range.  . Potassium 11/29/2014 3.7  3.5 - 5.1 mmol/L Final   Please note change in reference range.  . Chloride 11/29/2014 93* 96 - 112 mEq/L Final  . CO2 11/29/2014 30  19 - 32 mmol/L Final  . Glucose, Bld 11/29/2014 106* 70 - 99 mg/dL Final  . BUN 11/29/2014 18  6 - 23 mg/dL Final  . Creatinine, Ser 11/29/2014 1.21  0.50 - 1.35 mg/dL Final  . Calcium 11/29/2014 9.3  8.4 -  10.5 mg/dL Final  . Total Protein 11/29/2014 6.8  6.0 - 8.3 g/dL Final  . Albumin 11/29/2014 3.7  3.5 - 5.2 g/dL Final  . AST 11/29/2014 34  0 - 37 U/L Final  . ALT 11/29/2014 22  0 - 53 U/L Final  . Alkaline Phosphatase 11/29/2014 74  39 - 117 U/L Final  . Total Bilirubin 11/29/2014 0.5  0.3 - 1.2 mg/dL Final  . GFR calc non Af Amer 11/29/2014 56* >90 mL/min Final  . GFR calc Af Amer 11/29/2014 64* >90 mL/min Final   Comment: (NOTE) The eGFR has been calculated using the CKD EPI equation. This calculation has not been validated in all clinical situations. eGFR's persistently <90 mL/min signify possible Chronic Kidney Disease.   . Anion gap 11/29/2014 11  5 - 15 Final  . Lactic Acid, Venous 11/29/2014 2.77* 0.5 - 2.2 mmol/L Final  . Troponin I 11/29/2014 <0.03  <0.031 ng/mL Final   Comment:        NO INDICATION OF MYOCARDIAL INJURY. Please note change in reference range.   . Color, Urine 11/29/2014 AMBER* YELLOW Final   BIOCHEMICALS MAY BE AFFECTED BY COLOR  . APPearance 11/29/2014 CLEAR  CLEAR Final  . Specific Gravity, Urine 11/29/2014 1.018  1.005 - 1.030 Final  . pH 11/29/2014 6.5  5.0 - 8.0 Final  . Glucose, UA 11/29/2014 NEGATIVE  NEGATIVE mg/dL Final  . Hgb urine dipstick 11/29/2014 NEGATIVE  NEGATIVE Final  . Bilirubin Urine 11/29/2014 NEGATIVE  NEGATIVE Final  . Ketones, ur 11/29/2014 NEGATIVE  NEGATIVE mg/dL Final  . Protein, ur 11/29/2014 NEGATIVE  NEGATIVE mg/dL Final  . Urobilinogen, UA 11/29/2014 2.0* 0.0 - 1.0 mg/dL  Final  . Nitrite 11/29/2014 POSITIVE* NEGATIVE Final  . Leukocytes, UA 11/29/2014 MODERATE* NEGATIVE Final  . Squamous Epithelial / LPF 11/29/2014 RARE  RARE Final  . WBC, UA 11/29/2014 7-10  <3 WBC/hpf Final  . RBC / HPF 11/29/2014 0-2  <3 RBC/hpf Final  . Bacteria, UA 11/29/2014 MANY* RARE Final    No results found.   Assessment/Plan   ICD-9-CM ICD-10-CM   1. Alzheimer's disease - stable on depakote and namenda 331.0 G30.9   2. Essential hypertension - controlled on Lisinopril and HCTZ 401.9 I10   3. Hypothyroidism due to acquired atrophy of thyroid - controlled on levothyroxine 244.8 E03.8    246.8 E03.4   4. Primary osteoarthritis involving multiple joints - pain controlled on Tylenol 715.09 M15.0   5. BPH with elevated PSA - continue proscar and terazosin 600.00 N40.0    790.93 R97.2   6. B12 deficiency - on MVI 266.2 E53.8   7. Anemia due to other cause - stable 285.8 D64.89   8. Urinary incontinence, unspecified incontinence type 788.30 R32   9. Fall, initial encounter - place on fall risk bracelet E888.9 W19.XXXA     --continue medications as ordered  --continue PT/OT/ST as indicated  --remove sutures in the next few days  --GOAL: long term resident. Optimize tx. Will follow. Labs every 6 mos and prn. Discussed with nursing   Bovey S. Perlie Gold  [facility] [address] [place], [place] [address] [phone] Office (Wednesdays and Fridays 8 AM - 5 PM) [phone] Cell (Monday-Friday 8 AM - 5 PM)

## 2015-02-06 ENCOUNTER — Non-Acute Institutional Stay (SKILLED_NURSING_FACILITY): Payer: Medicare Other | Admitting: Adult Health

## 2015-02-06 DIAGNOSIS — I10 Essential (primary) hypertension: Secondary | ICD-10-CM

## 2015-02-06 DIAGNOSIS — M150 Primary generalized (osteo)arthritis: Secondary | ICD-10-CM

## 2015-02-06 DIAGNOSIS — F0280 Dementia in other diseases classified elsewhere without behavioral disturbance: Secondary | ICD-10-CM

## 2015-02-06 DIAGNOSIS — D6489 Other specified anemias: Secondary | ICD-10-CM | POA: Diagnosis not present

## 2015-02-06 DIAGNOSIS — E038 Other specified hypothyroidism: Secondary | ICD-10-CM

## 2015-02-06 DIAGNOSIS — M159 Polyosteoarthritis, unspecified: Secondary | ICD-10-CM

## 2015-02-06 DIAGNOSIS — J309 Allergic rhinitis, unspecified: Secondary | ICD-10-CM

## 2015-02-06 DIAGNOSIS — N400 Enlarged prostate without lower urinary tract symptoms: Secondary | ICD-10-CM

## 2015-02-06 DIAGNOSIS — E034 Atrophy of thyroid (acquired): Secondary | ICD-10-CM | POA: Diagnosis not present

## 2015-02-06 DIAGNOSIS — G309 Alzheimer's disease, unspecified: Secondary | ICD-10-CM

## 2015-03-08 ENCOUNTER — Encounter: Payer: Self-pay | Admitting: Adult Health

## 2015-03-08 NOTE — Progress Notes (Signed)
Patient ID: Richard Booth, male   DOB: 23-Jun-1934, 81 y.o.   MRN: [id number]  [facility]     No Known Allergies     Chief Complaint  Patient presents with  . Medical Management of Chronic Issues    HPI:  He is a resident of this facility being seen for the management of his chronic illnesses. Overall he is doing well. He is unable to fully participate in the hpi or ros. There are no nursing concerns today.    Past Medical History  Diagnosis Date  . Anemia   . Hypertension   . Dementia   . Altered mental status   . Osteoarthritis   . Alzheimer's dementia   . Vitamin B deficiency   . Allergic rhinitis   . Osteoarthritis   . Tobacco use disorder   . Edema   . BPH with obstruction/lower urinary tract symptoms   . Urinary incontinence   . Elevated prostate specific antigen (PSA)   . Hypothyroidism     No past surgical history on file.  VITAL SIGNS BP 118/78 mmHg  Pulse 82  Ht  (1.676 m)  Wt 141 lb (63.957 kg)  BMI 22.77 kg/m2  SpO2 97%   Outpatient Encounter Prescriptions as of 02/06/2015  Medication Sig  . acetaminophen (TYLENOL) 500 MG tablet Take 500 mg by mouth 3 (three) times daily as needed for moderate pain, fever or headache. Takes scheduled 3 times per day, but may also take every 4 hours if needed  . chlorhexidine (PERIDEX) 0.12 % solution Use as directed 15 mLs in the mouth or throat 2 (two) times daily.  . divalproex (DEPAKOTE) 125 MG DR tablet Take 125 mg by mouth 2 (two) times daily.  . ferrous sulfate 325 (65 FE) MG tablet Take 325 mg by mouth daily with breakfast.  . finasteride (PROSCAR) 5 MG tablet Take 5 mg by mouth daily with breakfast.   . fluticasone (FLONASE) 50 MCG/ACT nasal spray Place 1 spray into the nose daily before breakfast.   . hydrochlorothiazide (MICROZIDE) 12.5 MG capsule Take 12.5 mg by mouth daily. Hold if systolic pressure is less than 110 or dystolic blood pressure is is less than 60  . levothyroxine (SYNTHROID,  LEVOTHROID) 88 MCG tablet Take 88 mcg by mouth daily before breakfast.  . lisinopril (PRINIVIL,ZESTRIL) 10 MG tablet Take 10 mg by mouth daily with breakfast.   . magnesium hydroxide (MILK OF MAGNESIA) 400 MG/5ML suspension Take 30 mLs by mouth at bedtime as needed (for constipation).   . memantine (NAMENDA) 5 MG tablet Take 5 mg by mouth 2 (two) times daily.  . Multiple Vitamin (THERA/BETA-CAROTENE) TABS Take 1 tablet by mouth daily with breakfast.  . sodium chloride (OCEAN) 0.65 % SOLN nasal spray Place 2 sprays into both nostrils every 4 (four) hours as needed for congestion.  Marland Kitchen terazosin (HYTRIN) 5 MG capsule Take 5 mg by mouth at bedtime.       SIGNIFICANT DIAGNOSTIC EXAMS    LABS REVIEWED:   11-29-14: wbc 5.3; hgb 13.3; hct 39.7; mcv 96.1; plt 175; glucose 106; bun 18; creat 1.21; k+3.7; na++134; liver normal albumin 3.1    Review of Systems  Unable to perform ROS    Physical Exam  Constitutional: No distress.  Neck: Neck supple. No JVD present.  Cardiovascular: Normal rate, regular rhythm and intact distal pulses.   Respiratory: Effort normal and breath sounds normal. No respiratory distress. He has no wheezes.  GI: Soft. Bowel sounds are normal. He  exhibits no distension. There is no tenderness.  Musculoskeletal: He exhibits no edema.  Is able to move all extremities   Neurological: He is alert.  Skin: Skin is warm and dry. He is not diaphoretic.       ASSESSMENT/ PLAN:  1. Anemia: will continue iron daily  2. Hypothyroidism: will continue synthroid 88 mcg dialy   3. Hypertension: will continue lisinopril 10 mg daily; hctz 12.5 mg daily; hytrin 5 mg daily will monitor   4. Alzheimer's disease: will increase his namenda to 10 mg twice daily; and will monitor his status   5. BPH: will continue proscar 5 mg daily   6. Allergic rhinitis: will continue flonase daily   7. Osteoarthritis: will continue tylenol 500 mg three times daily for pain management    Synthia Innocenteborah Green NP [facility]  Contact [phone] Monday through Friday 8am- 5pm  After hours call [phone]

## 2015-03-14 ENCOUNTER — Non-Acute Institutional Stay (SKILLED_NURSING_FACILITY): Payer: Medicare Other | Admitting: Internal Medicine

## 2015-03-14 ENCOUNTER — Encounter: Payer: Self-pay | Admitting: Internal Medicine

## 2015-03-14 DIAGNOSIS — E034 Atrophy of thyroid (acquired): Secondary | ICD-10-CM

## 2015-03-14 DIAGNOSIS — E038 Other specified hypothyroidism: Secondary | ICD-10-CM

## 2015-03-14 DIAGNOSIS — M150 Primary generalized (osteo)arthritis: Secondary | ICD-10-CM | POA: Diagnosis not present

## 2015-03-14 DIAGNOSIS — G309 Alzheimer's disease, unspecified: Secondary | ICD-10-CM

## 2015-03-14 DIAGNOSIS — N400 Enlarged prostate without lower urinary tract symptoms: Secondary | ICD-10-CM

## 2015-03-14 DIAGNOSIS — I10 Essential (primary) hypertension: Secondary | ICD-10-CM | POA: Diagnosis not present

## 2015-03-14 DIAGNOSIS — M159 Polyosteoarthritis, unspecified: Secondary | ICD-10-CM

## 2015-03-14 DIAGNOSIS — F0280 Dementia in other diseases classified elsewhere without behavioral disturbance: Secondary | ICD-10-CM

## 2015-03-14 NOTE — Progress Notes (Signed)
Patient ID: Richard Booth, male   DOB: [birth year], 81 y.o.   MRN: [id number]    [place]    Place of Service:   SNF  03/14/15  No Known Allergies  Chief Complaint  Patient presents with  . Medical Management of Chronic Issues    HPI:  81 yo male long term resident seen today for f/u. He reports "i'm fine" and has no concerns. Dietary c/a weight loss. Reduced po intake.  Pt is a poor historian due to dementia. Hx obtained from chart  He receives dietary supplements TID with meals  He takes depakote to stabilize mood for dementia. He takes namenda for dementia.   Seasonal allergy sx's controlled on zyrtec and flonase  Urinary sx's controlled on proscar and terazosin  Thyroid stable on levothyroxine  BP controlled on terazosin, HCTZ and lisinopril  He takes several vitamins and wound care supplements  CODE STATUS: FULL CODE; MOST FORM ON CHART  Past Medical History  Diagnosis Date  . Anemia   . Hypertension   . Dementia   . Altered mental status   . Osteoarthritis   . Alzheimer's dementia   . Vitamin B deficiency   . Allergic rhinitis   . Osteoarthritis   . Tobacco use disorder   . Edema   . BPH with obstruction/lower urinary tract symptoms   . Urinary incontinence   . Elevated prostate specific antigen (PSA)   . Hypothyroidism    No past surgical history on file. History   Social History  . Marital Status: Married    Spouse Name: N/A  . Number of Children: N/A  . Years of Education: N/A   Social History Main Topics  . Smoking status: Smoker, Current Status Unknown  . Smokeless tobacco: Not on file  . Alcohol Use: No  . Drug Use: Not on file  . Sexual Activity: Not on file   Other Topics Concern  . None   Social History Narrative     Medications: Patient's Medications  New Prescriptions   No medications on file  Previous Medications   ACETAMINOPHEN (TYLENOL) 500 MG TABLET    Take 500 mg by mouth 3 (three) times daily  as needed for moderate pain, fever or headache. Takes scheduled 3 times per day, but may also take every 4 hours if needed   CHLORHEXIDINE (PERIDEX) 0.12 % SOLUTION    Use as directed 15 mLs in the mouth or throat 2 (two) times daily.   DIVALPROEX (DEPAKOTE) 125 MG DR TABLET    Take 125 mg by mouth 2 (two) times daily.   FERROUS SULFATE 325 (65 FE) MG TABLET    Take 325 mg by mouth daily with breakfast.   FINASTERIDE (PROSCAR) 5 MG TABLET    Take 5 mg by mouth daily with breakfast.    FLUTICASONE (FLONASE) 50 MCG/ACT NASAL SPRAY    Place 1 spray into the nose daily before breakfast.    HYDROCHLOROTHIAZIDE (MICROZIDE) 12.5 MG CAPSULE    Take 12.5 mg by mouth daily. Hold if systolic pressure is less than 631 or dystolic blood pressure is is less than 60   LEVOTHYROXINE (SYNTHROID, LEVOTHROID) 88 MCG TABLET    Take 88 mcg by mouth daily before breakfast.   LISINOPRIL (PRINIVIL,ZESTRIL) 10 MG TABLET    Take 10 mg by mouth daily with breakfast.    MAGNESIUM HYDROXIDE (MILK OF MAGNESIA) 400 MG/5ML SUSPENSION    Take 30 mLs by mouth at bedtime as needed (for  constipation).    MEMANTINE (NAMENDA) 5 MG TABLET    Take 5 mg by mouth 2 (two) times daily.   MULTIPLE VITAMIN (THERA/BETA-CAROTENE) TABS    Take 1 tablet by mouth daily with breakfast.   SODIUM CHLORIDE (OCEAN) 0.65 % SOLN NASAL SPRAY    Place 2 sprays into both nostrils every 4 (four) hours as needed for congestion.   TERAZOSIN (HYTRIN) 5 MG CAPSULE    Take 5 mg by mouth at bedtime.    Modified Medications   No medications on file  Discontinued Medications   No medications on file     Review of Systems  Unable to perform ROS: Dementia    Filed Vitals:   03/14/15 1659  BP: 135/72  Pulse: 76  Temp: 98.4 F (36.9 C)  Weight: 133 lb (60.328 kg) - down 5 lbs since 02/22/15  SpO2: 97%   Body mass index is 21.48 kg/(m^2).  Physical Exam  Constitutional:  Frail appearing in NAD. Lying in bed  HENT:  Mouth/Throat: Oropharynx is clear and  moist.  Eyes: Pupils are equal, round, and reactive to light. No scleral icterus.  Neck: Neck supple. Carotid bruit is not present. No thyromegaly present.  Cardiovascular: Normal rate, regular rhythm, normal heart sounds and intact distal pulses.  Exam reveals no gallop and no friction rub.   No murmur heard. no distal LE swelling. No calf TTP  Pulmonary/Chest: Effort normal and breath sounds normal. He has no wheezes. He has no rales. He exhibits no tenderness.  Abdominal: Soft. Bowel sounds are normal. He exhibits no distension, no abdominal bruit, no pulsatile midline mass and no mass. There is no tenderness. There is no rebound and no guarding.  Musculoskeletal: He exhibits edema.  LE b/l soft boots on foot/ankle  Lymphadenopathy:    He has no cervical adenopathy.  Neurological: He is alert.  Skin: Skin is warm and dry. No rash noted.  Psychiatric: He has a normal mood and affect. His behavior is normal.     Labs reviewed: Admission on 12/22/2014, Discharged on 12/22/2014  Component Date Value Ref Range Status  . WBC 12/22/2014 4.0  4.0 - 10.5 K/uL Final  . RBC 12/22/2014 3.93* 4.22 - 5.81 MIL/uL Final  . Hemoglobin 12/22/2014 12.8* 13.0 - 17.0 g/dL Final  . HCT 12/22/2014 37.5* 39.0 - 52.0 % Final  . MCV 12/22/2014 95.4  78.0 - 100.0 fL Final  . MCH 12/22/2014 32.6  26.0 - 34.0 pg Final  . MCHC 12/22/2014 34.1  30.0 - 36.0 g/dL Final  . RDW 12/22/2014 12.9  11.5 - 15.5 % Final  . Platelets 12/22/2014 186  150 - 400 K/uL Final  . Neutrophils Relative % 12/22/2014 62  43 - 77 % Final  . Neutro Abs 12/22/2014 2.5  1.7 - 7.7 K/uL Final  . Lymphocytes Relative 12/22/2014 26  12 - 46 % Final  . Lymphs Abs 12/22/2014 1.1  0.7 - 4.0 K/uL Final  . Monocytes Relative 12/22/2014 10  3 - 12 % Final  . Monocytes Absolute 12/22/2014 0.4  0.1 - 1.0 K/uL Final  . Eosinophils Relative 12/22/2014 2  0 - 5 % Final  . Eosinophils Absolute 12/22/2014 0.1  0.0 - 0.7 K/uL Final  . Basophils  Relative 12/22/2014 0  0 - 1 % Final  . Basophils Absolute 12/22/2014 0.0  0.0 - 0.1 K/uL Final  . Sodium 12/22/2014 131* 135 - 145 mmol/L Final  . Potassium 12/22/2014 3.4* 3.5 - 5.1 mmol/L Final  .  Chloride 12/22/2014 94* 96 - 112 mmol/L Final  . CO2 12/22/2014 30  19 - 32 mmol/L Final  . Glucose, Bld 12/22/2014 98  70 - 99 mg/dL Final  . BUN 12/22/2014 18  6 - 23 mg/dL Final  . Creatinine, Ser 12/22/2014 0.86  0.50 - 1.35 mg/dL Final  . Calcium 12/22/2014 8.8  8.4 - 10.5 mg/dL Final  . GFR calc non Af Amer 12/22/2014 81* >90 mL/min Final  . GFR calc Af Amer 12/22/2014 >90  >90 mL/min Final   Comment: (NOTE) The eGFR has been calculated using the CKD EPI equation. This calculation has not been validated in all clinical situations. eGFR's persistently <90 mL/min signify possible Chronic Kidney Disease.   . Anion gap 12/22/2014 7  5 - 15 Final  . Troponin i, poc 12/22/2014 0.00  0.00 - 0.08 ng/mL Final  . Comment 3 12/22/2014          Final   Comment: Due to the release kinetics of cTnI, a negative result within the first hours of the onset of symptoms does not rule out myocardial infarction with certainty. If myocardial infarction is still suspected, repeat the test at appropriate intervals.   . Color, Urine 12/22/2014 YELLOW  YELLOW Final  . APPearance 12/22/2014 CLEAR  CLEAR Final  . Specific Gravity, Urine 12/22/2014 1.014  1.005 - 1.030 Final  . pH 12/22/2014 6.5  5.0 - 8.0 Final  . Glucose, UA 12/22/2014 NEGATIVE  NEGATIVE mg/dL Final  . Hgb urine dipstick 12/22/2014 NEGATIVE  NEGATIVE Final  . Bilirubin Urine 12/22/2014 NEGATIVE  NEGATIVE Final  . Ketones, ur 12/22/2014 NEGATIVE  NEGATIVE mg/dL Final  . Protein, ur 12/22/2014 NEGATIVE  NEGATIVE mg/dL Final  . Urobilinogen, UA 12/22/2014 1.0  0.0 - 1.0 mg/dL Final  . Nitrite 12/22/2014 NEGATIVE  NEGATIVE Final  . Leukocytes, UA 12/22/2014 MODERATE* NEGATIVE Final  . Lactic Acid, Venous 12/22/2014 1.15  0.5 - 2.0 mmol/L  Final  . Squamous Epithelial / LPF 12/22/2014 RARE  RARE Final  . WBC, UA 12/22/2014 11-20  <3 WBC/hpf Final  . RBC / HPF 12/22/2014 0-2  <3 RBC/hpf Final  . Bacteria, UA 12/22/2014 FEW* RARE Final  . Specimen Description 12/22/2014 URINE, CATHETERIZED   Final  . Special Requests 12/22/2014 Normal   Final  . Colony Count 12/22/2014    Final                   Value:>=100,000 COLONIES/ML Performed at [facility]   . Culture 12/22/2014    Final                   Value:VANCOMYCIN RESISTANT ENTEROCOCCUS ISOLATED Note: PATIENT DISCHARGED  WILL FAX TO PROVIDER Performed at [facility]   . Report Status 12/22/2014 12/27/2014 FINAL   Final  . Organism ID, Bacteria 12/22/2014 VANCOMYCIN RESISTANT ENTEROCOCCUS ISOLATED   Final     Assessment/Plan   ICD-9-CM ICD-10-CM   1. Alzheimer's disease now with weight loss s/o worsening disease process 331.0 G30.9   2. Essential hypertension - controlled 401.9 I10   3. Hypothyroidism due to acquired atrophy of thyroid - stable 244.8 E03.8    246.8 E03.4   4. Primary osteoarthritis involving multiple joints - stable 715.09 M15.0   5. BPH (benign prostatic hyperplasia) - stable 600.00 N40.0      --cont nutritional supplement due to weight loss  --cont current meds as ordered  --PT/OT/ST as indicated  --will follow. Code status needs to  be readdressed due to declining condition  Richard Booth  [facility] [address] [place], [place] [address] [phone] Office (Wednesdays and Fridays 8 AM - 5 PM) [phone] Cell (Monday-Friday 8 AM - 5 PM)

## 2015-03-22 ENCOUNTER — Encounter: Payer: Self-pay | Admitting: *Deleted

## 2015-04-24 DEATH — deceased

## 2016-02-01 IMAGING — CR DG CHEST 1V PORT
1 series · 1 of 1 positions shown · non-contrast
Comparison: 03/18/2013, 03/17/2012

CLINICAL DATA: Altered mental status.  Increased shaking.

EXAM:
PORTABLE CHEST - 1 VIEW

[AP]
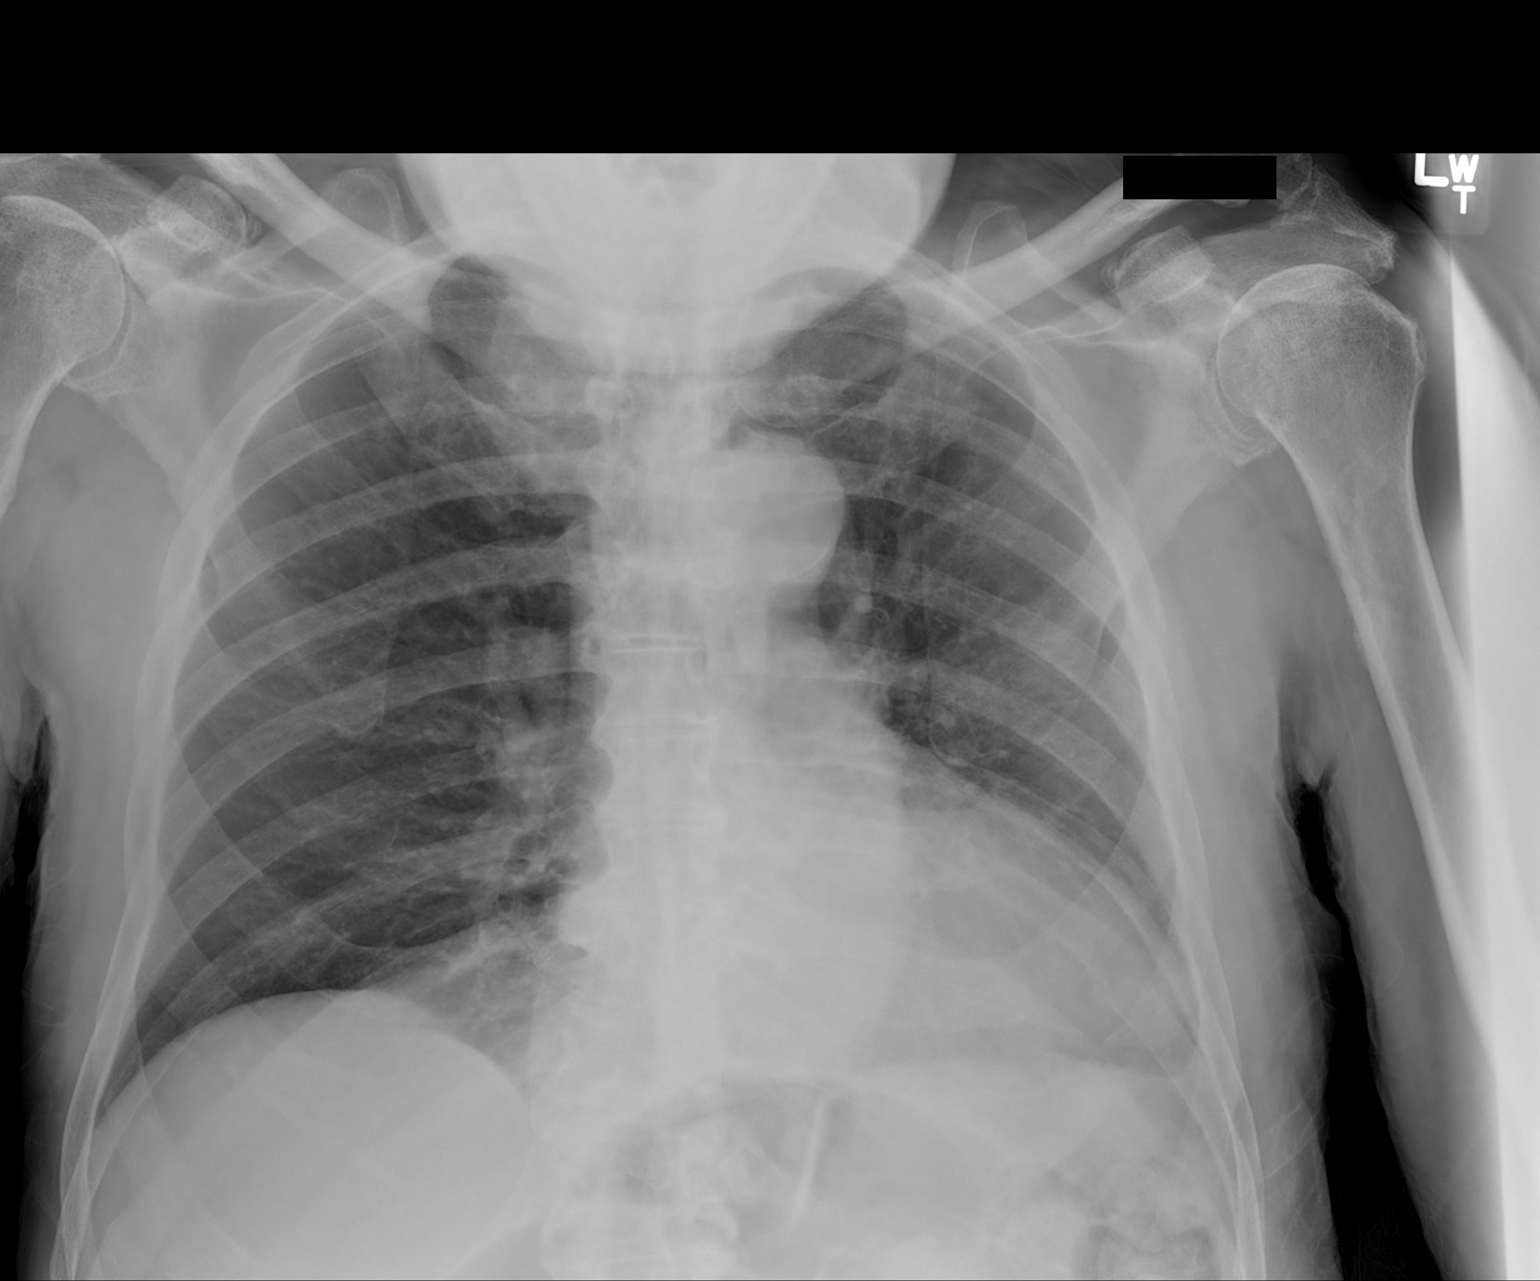

[1 of 1 positions shown; findings below may reference images not displayed]

FINDINGS: The cardiomediastinal contours are unchanged with borderline
cardiomegaly. Atherosclerosis of the aortic arch is unchanged. There
is again seen blunting of left costophrenic angle, likely related to
scarring. Decreased atelectasis in the left lower lobe. The right
lung is clear. Pulmonary vasculature is normal. No acute osseous
abnormalities.
IMPRESSION: 1.  No acute pulmonary process.
2. Blunting of left costophrenic angle, unchanged from multiple
priors and likely related to scarring.

## 2016-02-01 IMAGING — CT CT HEAD W/O CM
2 series · 16 of 30 positions shown, 20 images · non-contrast
Comparison: 10/05/2014

CLINICAL DATA: Altered mental status, unresponsive.

EXAM:
CT HEAD WITHOUT CONTRAST
TECHNIQUE: Contiguous axial images were obtained from the base of the skull
through the vertex without intravenous contrast.

[Series 2: head w/o · axial · non-contrast · 0.45mm/px · z∈[+1653,+1783]mm · 13 of 32 slices shown, 17 images]
[im 3/32  brain]
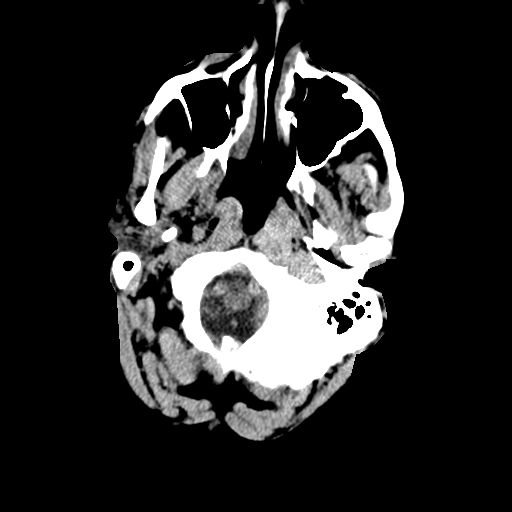
[im 3/32  bone]
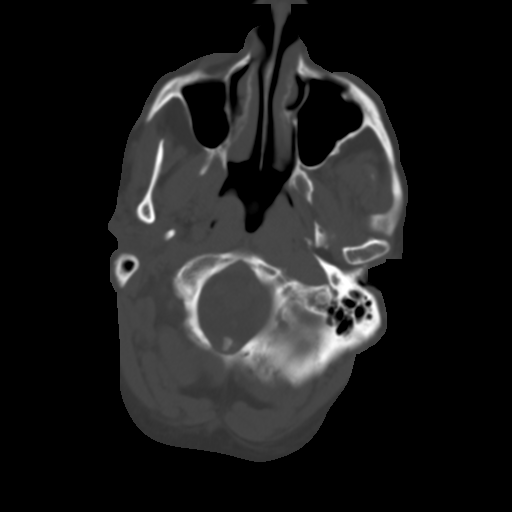
[im 5/32  brain]
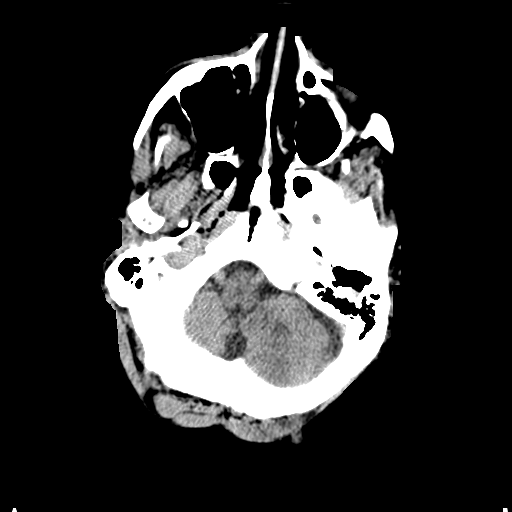
[im 7/32  brain]
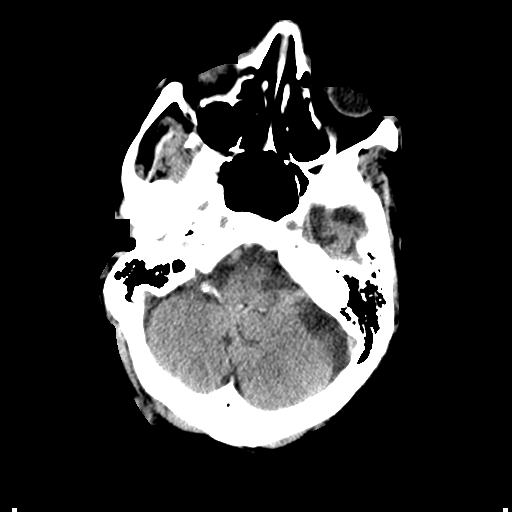
[im 9/32  brain]
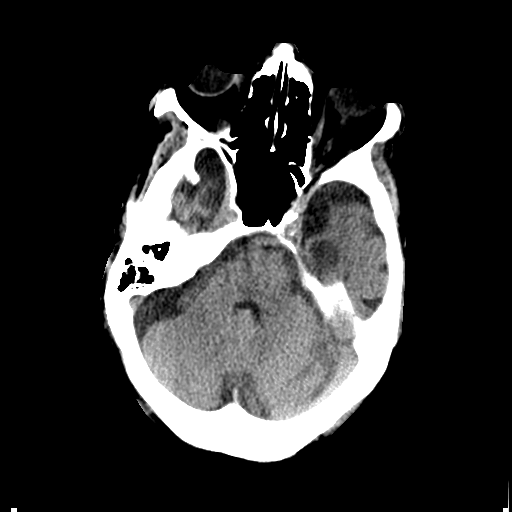
[im 12/32  brain]
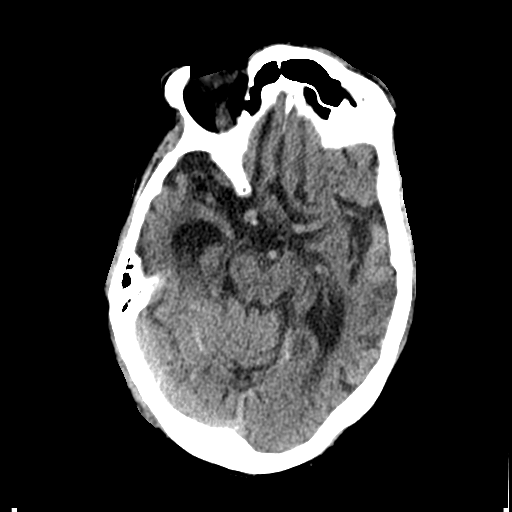
[im 12/32  bone]
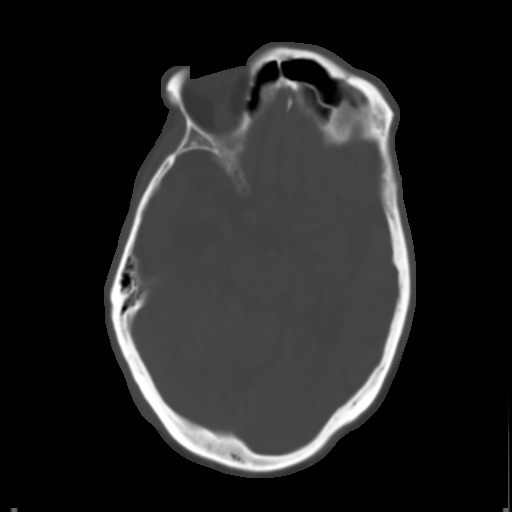
[im 14/32  brain]
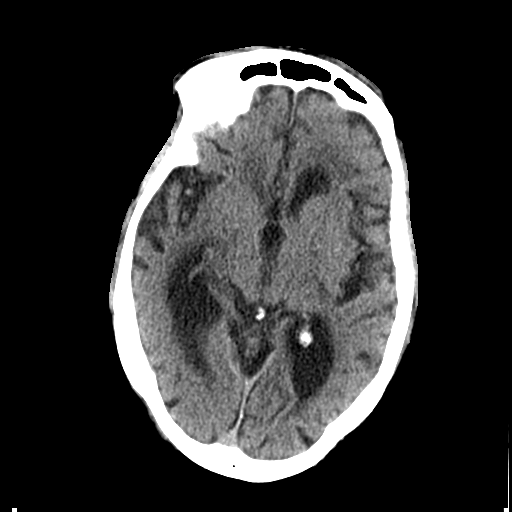
[im 16/32  brain]
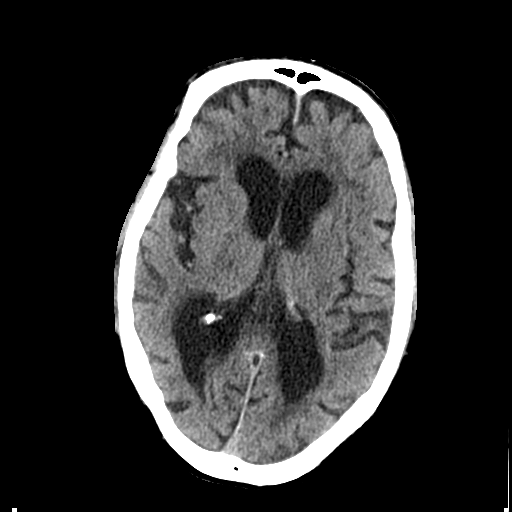
[im 18/32  brain]
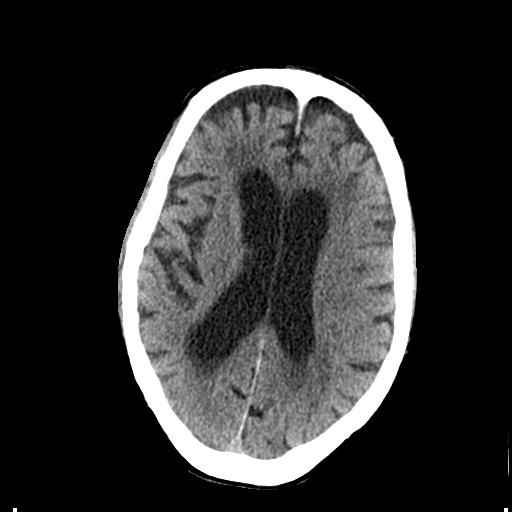
[im 20/32  brain]
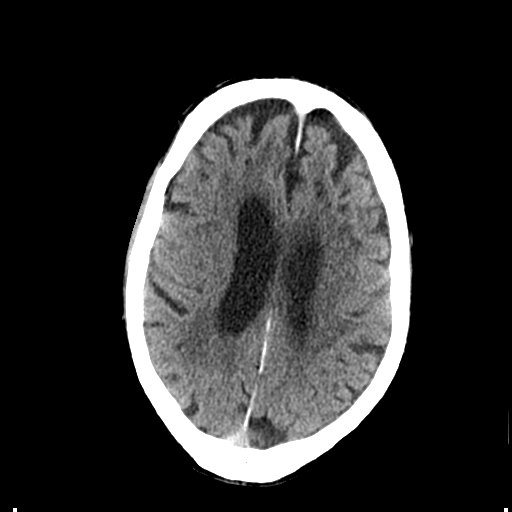
[im 20/32  bone]
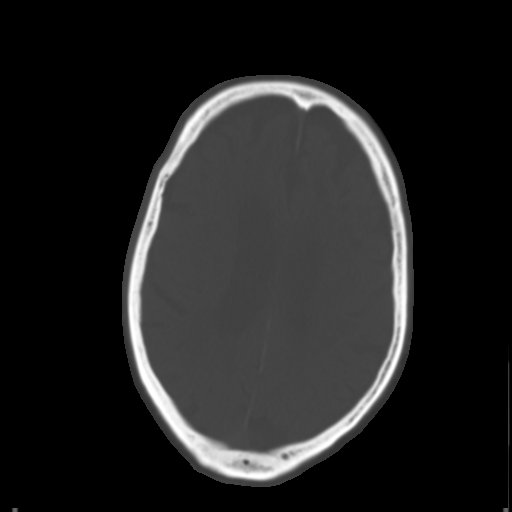
[im 23/32  brain]
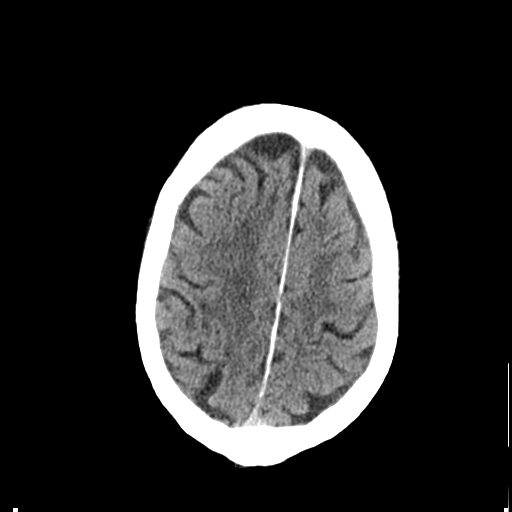
[im 25/32  brain]
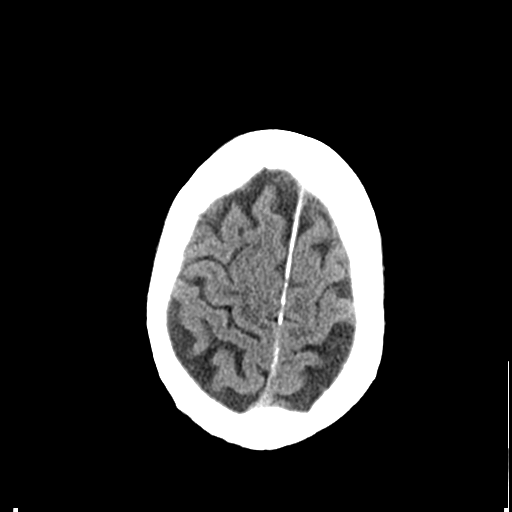
[im 27/32  brain]
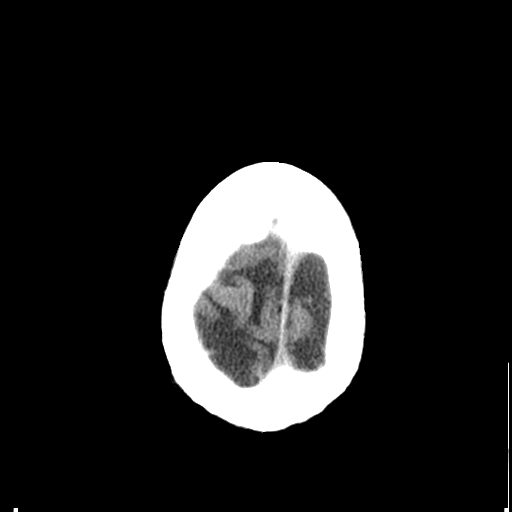
[im 29/32  brain]
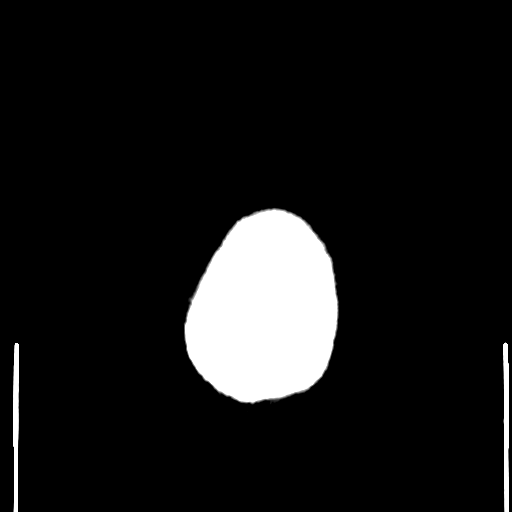
[im 29/32  bone]
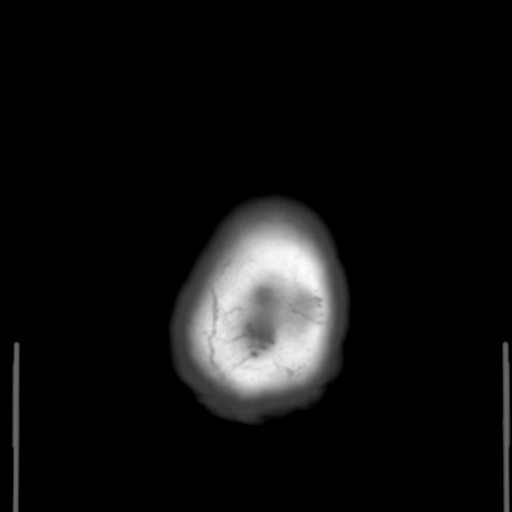

[Series 3: bone windows · axial · 0.45mm/px · z∈[+1653,+1698]mm · 3 of 32 slices shown]
[im 3/32  bone]
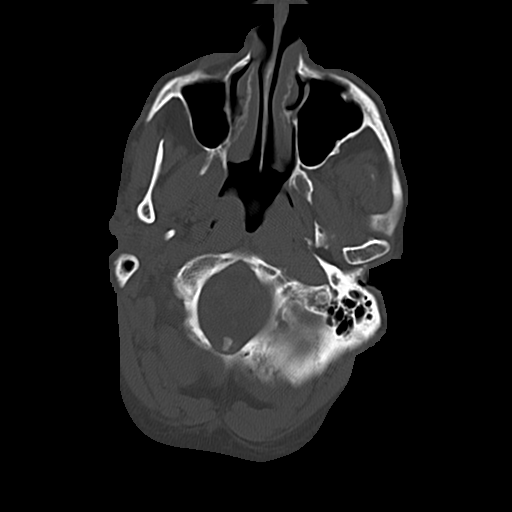
[im 7/32  bone]
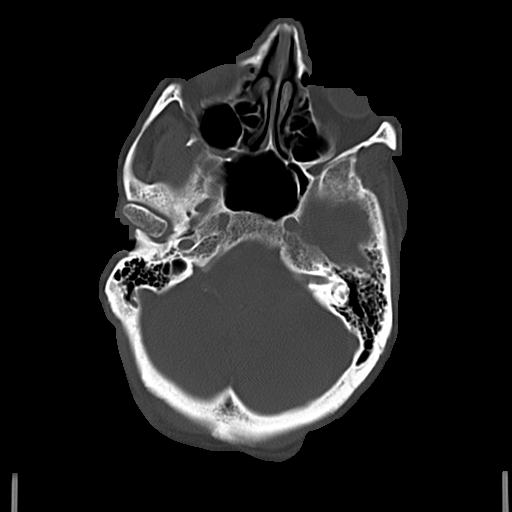
[im 12/32  bone]
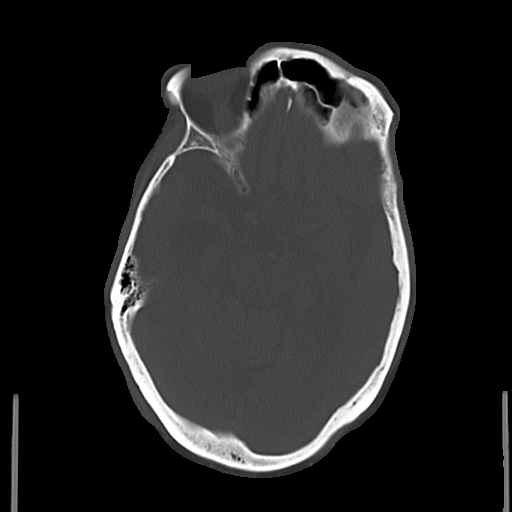

[16 of 30 positions shown; findings below may reference images not displayed]

FINDINGS: No intracranial hemorrhage. Diffuse atrophy and chronic small vessel
ischemia, unchanged from prior exam. Ventricles are unchanged in
size. There is no subdural fluid collection. No findings of
territorial infarct. Calvarium is intact. Included paranasal sinuses
and mastoid air cells are well aerated.
IMPRESSION: 1.  No acute intracranial abnormality.
2. Stable atrophy and chronic small vessel ischemic change.

## 2016-02-24 IMAGING — CR DG CHEST 2V
3 series · 3 of 3 positions shown · non-contrast
Comparison: 11/29/2014

CLINICAL DATA: RN notes: [REDACTED] presents with a 80 yo male from
[facility] with a unwitnessed fall from a
wheelchair. Patient has a 1 to 1 1/2 inch laceration just above left
eyebrow. Hx HTN. No LOC, no toleration of movement. Very stiff
musculature. Hx of dementia. Normal orientation for pt at this time.
No crepitus. No other complains of pain. Afib on monitor and
hypertensive at this time.

EXAM:
CHEST  2 VIEW

[w chest lat]
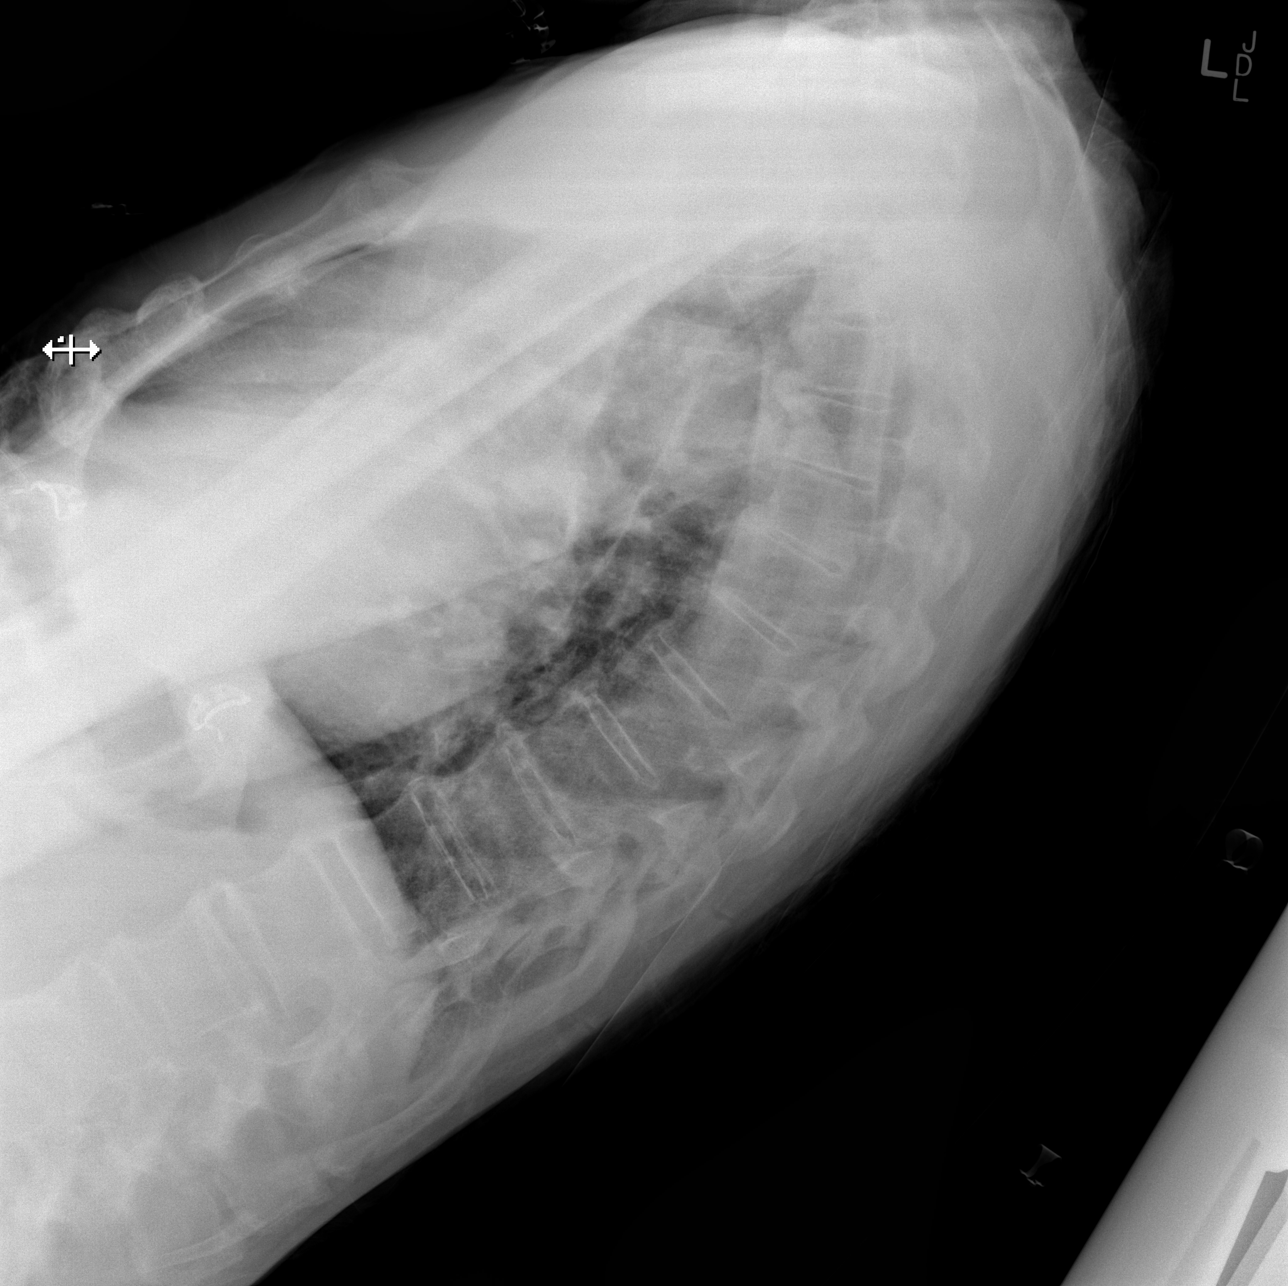

[x chest ap (1 of 2)]
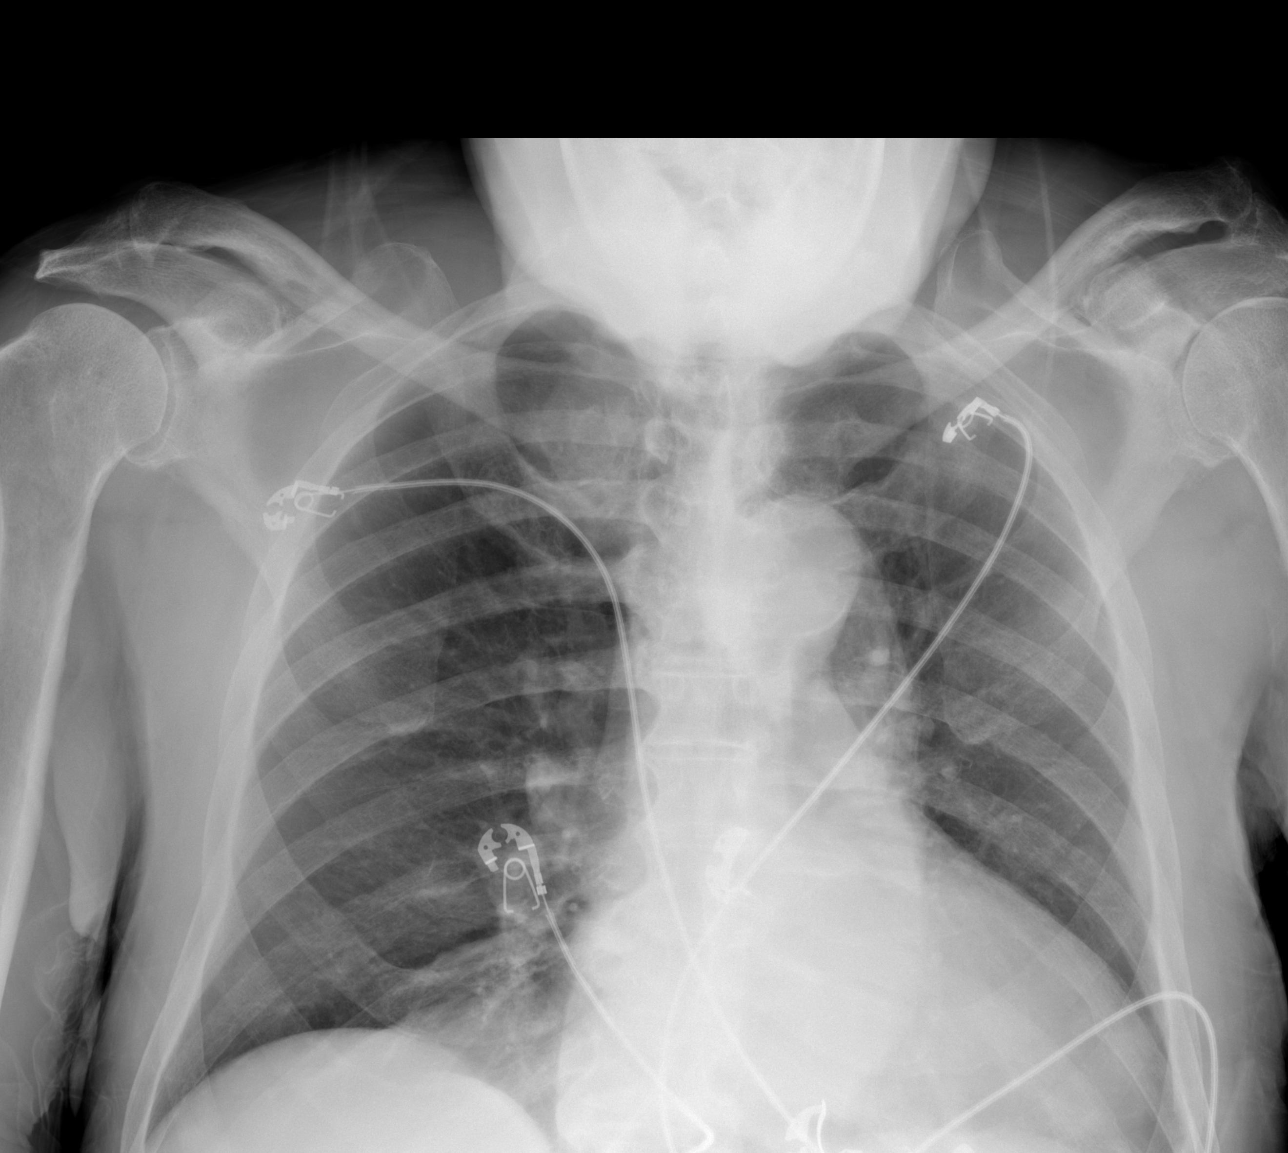

[x chest ap (2 of 2)]
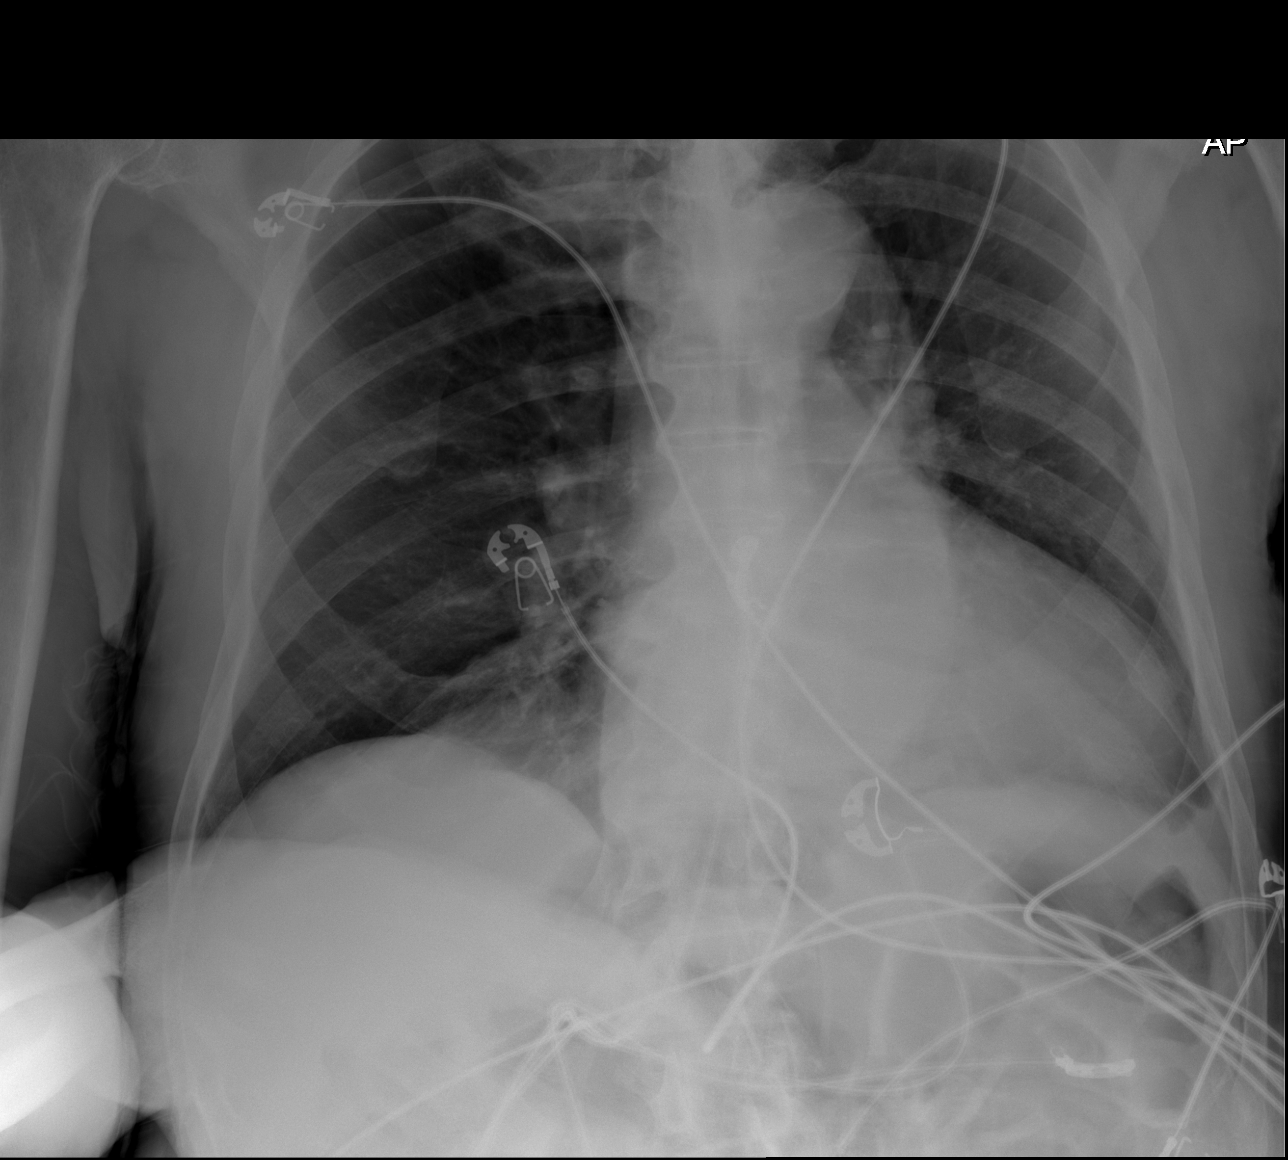

[3 of 3 positions shown; findings below may reference images not displayed]

FINDINGS: Mild enlargement of the cardiopericardial silhouette. Aorta is
uncoiled. No mediastinal or hilar masses.

Clear lungs.  No pleural effusion or pneumothorax.

Bony thorax is grossly intact.
IMPRESSION: No acute cardiopulmonary disease.
